# Patient Record
Sex: Female | Born: 1940 | Race: White | Hispanic: No | Marital: Married | Smoking: Former smoker
Health system: Southern US, Community
[De-identification: ages and names within clinical notes are randomized; demographics above are authoritative.]

## PROBLEM LIST (undated history)

## (undated) DIAGNOSIS — K8020 Calculus of gallbladder without cholecystitis without obstruction: Secondary | ICD-10-CM

## (undated) DIAGNOSIS — J189 Pneumonia, unspecified organism: Secondary | ICD-10-CM

## (undated) DIAGNOSIS — R519 Headache, unspecified: Secondary | ICD-10-CM

## (undated) DIAGNOSIS — Z8719 Personal history of other diseases of the digestive system: Secondary | ICD-10-CM

## (undated) DIAGNOSIS — T8859XA Other complications of anesthesia, initial encounter: Secondary | ICD-10-CM

## (undated) DIAGNOSIS — Z973 Presence of spectacles and contact lenses: Secondary | ICD-10-CM

## (undated) DIAGNOSIS — T4145XA Adverse effect of unspecified anesthetic, initial encounter: Secondary | ICD-10-CM

## (undated) DIAGNOSIS — R51 Headache: Secondary | ICD-10-CM

## (undated) DIAGNOSIS — Z972 Presence of dental prosthetic device (complete) (partial): Secondary | ICD-10-CM

## (undated) HISTORY — PX: TONSILLECTOMY: SUR1361

## (undated) HISTORY — PX: ABDOMINAL HYSTERECTOMY: SHX81

## (undated) HISTORY — PX: DILATION AND CURETTAGE OF UTERUS: SHX78

## (undated) HISTORY — PX: COLONOSCOPY: SHX174

## (undated) HISTORY — PX: SHOULDER ARTHROSCOPY: SHX128

## (undated) HISTORY — PX: BACK SURGERY: SHX140

## (undated) HISTORY — PX: CHOLECYSTECTOMY: SHX55

## (undated) HISTORY — PX: FOOT SURGERY: SHX648

## (undated) HISTORY — PX: MULTIPLE TOOTH EXTRACTIONS: SHX2053

---

## 2000-02-09 ENCOUNTER — Encounter: Admission: RE | Admit: 2000-02-09 | Discharge: 2000-02-09 | Payer: Self-pay | Admitting: Family Medicine

## 2000-02-09 ENCOUNTER — Encounter: Payer: Self-pay | Admitting: Family Medicine

## 2001-02-21 ENCOUNTER — Encounter: Admission: RE | Admit: 2001-02-21 | Discharge: 2001-02-21 | Payer: Self-pay | Admitting: Family Medicine

## 2001-02-21 ENCOUNTER — Encounter: Payer: Self-pay | Admitting: Family Medicine

## 2001-02-26 ENCOUNTER — Encounter: Admission: RE | Admit: 2001-02-26 | Discharge: 2001-02-26 | Payer: Self-pay | Admitting: Family Medicine

## 2001-02-26 ENCOUNTER — Encounter: Payer: Self-pay | Admitting: Family Medicine

## 2002-03-09 ENCOUNTER — Encounter: Admission: RE | Admit: 2002-03-09 | Discharge: 2002-03-09 | Payer: Self-pay | Admitting: Family Medicine

## 2002-03-09 ENCOUNTER — Encounter: Payer: Self-pay | Admitting: Family Medicine

## 2003-03-30 ENCOUNTER — Encounter: Admission: RE | Admit: 2003-03-30 | Discharge: 2003-03-30 | Payer: Self-pay | Admitting: Family Medicine

## 2003-03-30 ENCOUNTER — Encounter: Payer: Self-pay | Admitting: Family Medicine

## 2003-08-10 ENCOUNTER — Inpatient Hospital Stay (HOSPITAL_COMMUNITY): Admission: RE | Admit: 2003-08-10 | Discharge: 2003-08-13 | Payer: Self-pay | Admitting: Neurosurgery

## 2003-09-09 ENCOUNTER — Encounter: Admission: RE | Admit: 2003-09-09 | Discharge: 2003-09-09 | Payer: Self-pay | Admitting: Neurosurgery

## 2003-11-09 ENCOUNTER — Encounter: Admission: RE | Admit: 2003-11-09 | Discharge: 2003-11-09 | Payer: Self-pay | Admitting: Neurosurgery

## 2004-04-24 ENCOUNTER — Encounter: Admission: RE | Admit: 2004-04-24 | Discharge: 2004-04-24 | Payer: Self-pay | Admitting: Obstetrics and Gynecology

## 2005-05-23 ENCOUNTER — Encounter: Admission: RE | Admit: 2005-05-23 | Discharge: 2005-05-23 | Payer: Self-pay | Admitting: Obstetrics and Gynecology

## 2005-11-05 ENCOUNTER — Encounter: Admission: RE | Admit: 2005-11-05 | Discharge: 2005-11-05 | Payer: Self-pay | Admitting: Family Medicine

## 2006-05-27 ENCOUNTER — Encounter: Admission: RE | Admit: 2006-05-27 | Discharge: 2006-05-27 | Payer: Self-pay | Admitting: Obstetrics and Gynecology

## 2006-08-19 ENCOUNTER — Encounter: Admission: RE | Admit: 2006-08-19 | Discharge: 2006-08-19 | Payer: Self-pay | Admitting: Neurosurgery

## 2006-09-25 ENCOUNTER — Encounter: Admission: RE | Admit: 2006-09-25 | Discharge: 2006-09-25 | Payer: Self-pay | Admitting: Neurosurgery

## 2007-05-29 ENCOUNTER — Encounter: Admission: RE | Admit: 2007-05-29 | Discharge: 2007-05-29 | Payer: Self-pay | Admitting: Obstetrics and Gynecology

## 2007-06-23 ENCOUNTER — Encounter: Admission: RE | Admit: 2007-06-23 | Discharge: 2007-06-23 | Payer: Self-pay | Admitting: Family Medicine

## 2007-07-27 ENCOUNTER — Emergency Department (HOSPITAL_COMMUNITY): Admission: EM | Admit: 2007-07-27 | Discharge: 2007-07-27 | Payer: Self-pay | Admitting: Emergency Medicine

## 2008-06-01 ENCOUNTER — Encounter: Admission: RE | Admit: 2008-06-01 | Discharge: 2008-06-01 | Payer: Self-pay | Admitting: Family Medicine

## 2008-12-09 ENCOUNTER — Encounter: Admission: RE | Admit: 2008-12-09 | Discharge: 2008-12-09 | Payer: Self-pay | Admitting: Family Medicine

## 2009-06-06 ENCOUNTER — Encounter: Admission: RE | Admit: 2009-06-06 | Discharge: 2009-06-06 | Payer: Self-pay | Admitting: Family Medicine

## 2009-12-09 ENCOUNTER — Encounter: Admission: RE | Admit: 2009-12-09 | Discharge: 2009-12-09 | Payer: Self-pay | Admitting: Family Medicine

## 2010-06-12 ENCOUNTER — Encounter: Admission: RE | Admit: 2010-06-12 | Discharge: 2010-06-12 | Payer: Self-pay | Admitting: Family Medicine

## 2010-12-22 NOTE — Op Note (Signed)
NAME:  SHONIKA, KOLASINSKI NO.:  [id number]   MEDICAL RECORD NO.:  [id number]                   PATIENT TYPE:  INP   LOCATION:  3172                                 FACILITY:  [facility]   PHYSICIAN:  Kathaleen Maser. Pool, M.D.                 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   OPERATION PERFORMED:  L4-5 decompressive laminectomy with foraminotomies.  L4-5 posterior lumbar interbody fusion utilizing tangent wedges and local  autografting.  L4-5 posterolateral fusion utilizing pedicle screw  instrumentation and local autografting.   SURGEON:  Kathaleen Maser. Pool, M.D.   ASSISTANT:  Reinaldo Meeker, M.D.   ANESTHESIA:  General endotracheal.   INDICATIONS FOR PROCEDURE:  Ms. Spurling is a 70 year old female with history  of back and bilateral lower extremity pain right greater than left.  Work-  ups demonstrated evidence of a grade 1 L4-5 degenerative spondylolisthesis  with significant stenosis.  The patient has failed conservative management.  She presents now for decompression and fusion in hopes of improving her  symptoms.   DESCRIPTION OF PROCEDURE:  The patient was taken to the operating room and  placed on the table in the supine position.  After adequate level of  anesthesia was achieved, the patient was positioned prone onto a Wilson  frame and appropriately padded.  The patient's lumbar region was prepped and  draped sterilely.  A 10 blade was used to make a linear skin incision  overlying the L3, 4 and 5 levels.  This was carried down sharply in the  midline.  A subperiosteal dissection was then performed exposing the lamina  and facet joints of L3, L4 and L5 as well as the  transverse processes of L4  and L5 bilaterally.  Deep self-retaining retractor was placed.  Intraoperative x-ray was taken, the level was confirmed.  Complete  laminectomy of L4 was then performed using Leksell rongeurs, Kerrison  rongeurs and high speed drill to remove the entire lamina of L4 and the  entire inferior facet complex of L4 and the superior facet complex of L5 as  well as the superior aspect of the lamina of L5.  All bone was cleaned and  used in later autografting.  The ligamentum flavum was then elevated and  resected in piecemeal fashion using Kerrison rongeurs.  The underlying  thecal sac and exiting L4 and L5 nerve roots were identified.  Wide  foraminotomies were performed along their course.  This completely relieved  the patient's stenosis.  Epidural venous plexus was coagulated and cut.  Starting first on the left side, the thecal sac and nerve roots were  protected.  Disk space was  then incised with a 15 blade in rectangular  fashion.  A wide disk space cleanout was then achieved pituitary rongeurs  and upward angled pituitary rongeurs and Epstein curets.  After a very  thorough diskectomy had been performed, the disk space was dilated up to 12  mm and attention was placed to the right side.  Once again, thecal sac and  nerve roots were protected and an aggressive diskectomy was carried out on  the patient's right side.  Following the diskectomy, the patient's disk  space was then further reamed using a 12 mm tangent box cutter and then cut  using a 12 mm tangent chisel.  Soft tissue was removed from the interspace.  A 12 x 26 mm tangent wedge was then impacted into place and then recessed  approximately 2 mm from the posterior cortical margin.  Attention was then  placed to the patient's left side.  Thecal sac and nerve roots were then  protected on the left.  Distractor was removed.  The disk space was then  reamed and then cut with 12 mm tangent instruments.  The  disk space was  further curettaged.  Morselized autograft was then packed into the  interspace.  A second 12 x 26 mm tangent wedge was then impacted into place  and recessed approximately 2 mm from the posterior cortical margin.  Pedicles of L4 and L5 were then identified using surface landmarks and  intraoperative fluoroscopy.  Superficial bone around the pedicles was  removed using the high speed drill.  Each pedicle was then probed using a  pedicle awl.  Each pedicle awl tract was then probed and found to be solidly  within bone.  Each pedicle awl tract was then tapped with a 5.25 mm screw  tap.  Each screw pedicle was probed and found to be solidly within bone.  6.75 x 45 mm spiral 90D screws were placed bilaterally at L4 and 6.75 x 40  mm spiral 90 screws were placed bilaterally at L5.  Transverse processes of  L4 and L5 were then decorticated using high speed drill.  Morcelized  autograft was packed posterolaterally for later fusion.  A short segment  titanium rod was then contoured and placed over the screw heads of L4 and  L5.  This was then attached using locking caps.  Locking caps were then  engaged in sequential fashion with the construct under compression.  Final  images revealed good position of bone grafts and hardware at the proper  operative level with normal alignment of the spine.  The wound was then  irrigated with antibiotic solution one final time.  Gelfoam was placed  topically. A medium Hemovac drain was left in the epidural space.  The wound  was then closed in layers with Vicryl sutures.  Steri-Strips and sterile  dressing were applied.  There were no apparent complications.  The patient  tolerated the procedure well and returned to the recovery room  postoperatively.                                               Henry A. Pool, M.D.    HAP/MEDQ  D:  08/10/2003  T:  08/10/2003  Job:  [id number]

## 2010-12-22 NOTE — Discharge Summary (Signed)
NAME:  Eileen Simpson, Eileen Simpson NO.:  [id number]   MEDICAL RECORD NO.:  [id number]                   PATIENT TYPE:  INP   LOCATION:  3013                                 FACILITY:  [facility]   PHYSICIAN:  Kathaleen Maser. Pool, M.D.                 DATE OF BIRTH:  1941/06/21   DATE OF ADMISSION:  08/10/2003  DATE OF DISCHARGE:  08/13/2003                                 DISCHARGE SUMMARY   FINAL DIAGNOSIS:  L4-5 degenerative spondylolisthesis with stenosis.   OPERATIONS AND TREATMENTS:  L4-5 decompression and fusion with  instrumentation.   HISTORY OF PRESENT ILLNESS:  Ms. Pigford is a 70 year old female with a  history of back and bilateral lower extremity symptoms secondary to lumbar  stenosis.  Workups demonstrated evidence of significant degenerative  spondylolisthesis and marked stenosis.  The patient presents now for  decompression and fusion with instrumentation.   HOSPITAL COURSE:  The patient went to the operating room and underwent  uncomplicated L4-5 decompressive laminectomy followed by posterior lumbar  interbody fusion with Tangent wedges and local autografting coupled with  posterolateral fusion, using pedicle screw fixation and local autografting  was performed.  Postoperatively the patient did quite well.  Lower extremity  pain was completely resolved.  Her strength and sensation were improved.   At the time of discharge, the wound is healing well.  There is no evidence  of infection.  She is tolerating regular diet, ambulating without  difficulty.  This patient will be discharged home.  She will follow-up in my  office in one week.   CONDITION ON DISCHARGE:  Improved.                                                Henry A. Pool, M.D.    HAP/MEDQ  D:  09/29/2003  T:  09/30/2003  Job:  [id number]

## 2011-04-13 ENCOUNTER — Encounter (HOSPITAL_BASED_OUTPATIENT_CLINIC_OR_DEPARTMENT_OTHER)
Admission: RE | Admit: 2011-04-13 | Discharge: 2011-04-13 | Disposition: A | Discharge status: Home / Self Care | Payer: Medicare Other | Source: Ambulatory Visit | Attending: Orthopedic Surgery | Admitting: Orthopedic Surgery

## 2011-04-18 ENCOUNTER — Ambulatory Visit (HOSPITAL_BASED_OUTPATIENT_CLINIC_OR_DEPARTMENT_OTHER)
Admission: RE | Admit: 2011-04-18 | Discharge: 2011-04-18 | Disposition: A | Discharge status: Home / Self Care | Payer: Medicare Other | Source: Ambulatory Visit | Attending: Orthopedic Surgery | Admitting: Orthopedic Surgery

## 2011-04-18 DIAGNOSIS — Q66219 Congenital metatarsus primus varus, unspecified foot: Secondary | ICD-10-CM | POA: Insufficient documentation

## 2011-04-18 DIAGNOSIS — Z01812 Encounter for preprocedural laboratory examination: Secondary | ICD-10-CM | POA: Insufficient documentation

## 2011-04-18 DIAGNOSIS — Z01810 Encounter for preprocedural cardiovascular examination: Secondary | ICD-10-CM | POA: Insufficient documentation

## 2011-04-18 DIAGNOSIS — M2010 Hallux valgus (acquired), unspecified foot: Secondary | ICD-10-CM | POA: Insufficient documentation

## 2011-04-19 LAB — POCT HEMOGLOBIN-HEMACUE: Hemoglobin: 13.9 g/dL (ref 12.0–15.0)

## 2011-04-19 NOTE — Op Note (Signed)
NAME:  Eileen Simpson, Eileen Simpson NO.:  [id number]  MEDICAL RECORD NO.:  [id number]  LOCATION:                                 FACILITY:  PHYSICIAN:  Leonides Grills, M.D.     DATE OF BIRTH:  1940/09/08  DATE OF PROCEDURE:  04/18/2011 DATE OF DISCHARGE:                              OPERATIVE REPORT   PREOPERATIVE DIAGNOSES: 1. 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  POSTOPERATIVE DIAGNOSES: 1. 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  OPERATION: 1. Left first TMP joint fusion with osteotomy. 2. Left second TMP joint fusion without osteotomy. 3. Left modified McBride bunionectomy.  ANESTHESIA:  General.  SURGEON:  Leonides Grills, MD  ASSISTANT:  Richardean Canal, PA  ESTIMATED BLOOD LOSS:  Minimal.  TOURNIQUET TIME:  Approximately an hour and a half.  COMPLICATIONS:  None.  DISPOSITION:  Stable to PR.  INDICATIONS:  This is a 70 year old female who has had longstanding left bunion pain as well as midfoot pain that was interfering with her life to which point she could not do what she wants to.  Due to the above pathology, she was consented for the above procedure.  All risks of infection or vessel injury, nonunion, malunion, hardware tissue, hardware failure, persistent pain, worse pain, prolonged recovery, stiffness, arthritis, recurrence of hallux valgus, Bowman hallux varus, DVT, PE, wound healing problems were all explained, questions were encouraged and answered.  OPERATIVE PROCEDURE:  The patient was brought to the operating room, placed in a supine position.  After adequate general endotracheal anesthesia was administered as well as Ancef 1 g IV piggyback, left lower extremity was prepped and draped sterile manner over proximally placed thigh tourniquet.  Limb was gravity exsanguinated.  Tourniquet to elevated  to 290 mmHg.  A longitudinal incision over the dorsal aspect of the left great toe MTP joint laterally was then made.  Dissection was carried down through skin.  Hemostasis was obtained.  Careful dissection was carried down to the lateral capsule.  This was then released.  Once the lateral capsule was released, we then released the abductor tendon to the sesamoids as well as the adductor halluces to the base of first metatarsal.  Once this was done, we were able to reduce the sesamoids nicely.  We then made a longitudinal incision over the medial aspect of the great toe.  Midline medial dissection was carried down through skin. Hemostasis was obtained.  Dorsal and medial digital nerve was carefully dissected with retractor out of harm's way throughout the case.  L- shaped capsulotomy was then made.  Bunion eminence was trimmed off with a rongeur.  We then made a longitudinal incision midline between EHL and EHB.  Dissection was carried down through skin.  Hemostasis was obtained.  Interval was developed to an EHL and EHB first and second TMP joint.  Soft tissue was elevated off the dorsal aspect protecting the neurovascular structures and tendons.  Both tendons were protected within their sheaths and were not violated.  Once this was done, we then entered the first TMP joint and with a sagittal  saw an osteotomy was made in both the base of first metatarsal and medial cuneiform.  This was a closing wedge type osteotomy.  We then reduced the joint.  Once this was meticulously cleared and perfectly congruent, we obtained C-arm views to verify that this had excellent correction of the metatarsal primus varus.  We then took the second TMP joint and the Lisfranc ligament area.  Lisfranc's ligament was removed as well as the remaining cartilage from the second TMP joint as well curved corners osteotome, curette, and rongeur.  We then placed multiple 2-mm drill holes on either side of the joint.   We then reduced the first TMP joint with a two-point reduction clamp and compressed this down.  We then created a notch at the base of first metatarsal.  We then placed a 3.5-mm fully- threaded cortical lag screw using a 3.5 and 2.5 mm drill hole respectively.  This had excellent purchase, compression, and maintenance of the desired position.  We then obtained stress x-rays in the AP and lateral planes and showed gross motion fixation, proposition, and excellent alignment as well.  We then reduced the second TMP joint with a two-point reduction clamp.  Once this was reduced and compressed, we then made a small longitudinal incision over the medial aspect of the medial cuneiform.  We then placed a 3.5-mm fully-threaded cortical set screw using a 2.5-mm drill hole respectively.  This had excellent purchase and maintenance of desired position.  This was 32 mm in length. We then placed a 3.5-mm fully-threaded cortical lag screw from the medial cuneiform into the base of first metatarsal.  This was done with a 3.5 and 2.5 mm hole respectively.  This had excellent purchase and maintenance of the desired position and compression across the first TMP joint.  We then obtained stress x-rays in AP lateral planes which showed no gross motion fixation, proposition, and excellent alignment as well. We then reduced the sesamoids and advanced the capsule both superiorly and proximally, reconstructed this with 2-0 Vicryl stitches and had outstanding repair.  We then obtained x-rays to verify that the sesamoids were well located and the hallux valgus deformity was corrected.  The area was copiously irrigated with normal saline. Hemostasis was obtained.  Subcu was closed with 3-0 Vicryl, skin was closed with 4-0 nylon over all wounds.  Sterile dressing was applied. Modified Chamberland dressing was applied with the ankle in neutral dorsiflexion.  The patient was stable to PR.  Of note to the first ray was  adequately plantar flexed and this was palpated once this was fixed and a surgical PA assistant was used throughout the case to not only retract neurovascular structure out of harm's way, but also to aid in the fixation, reduction, and throughout the entire procedure.     Leonides Grills, M.D.     PB/MEDQ  D:  04/18/2011  T:  04/18/2011  Job:  [id number]  Electronically Signed by Leonides Grills M.D. on 04/19/2011 06:42:39 PM

## 2011-05-29 ENCOUNTER — Other Ambulatory Visit: Payer: Self-pay | Admitting: Family Medicine

## 2011-05-29 DIAGNOSIS — Z1231 Encounter for screening mammogram for malignant neoplasm of breast: Secondary | ICD-10-CM

## 2011-06-14 ENCOUNTER — Emergency Department (HOSPITAL_COMMUNITY)
Admission: EM | Admit: 2011-06-14 | Discharge: 2011-06-14 | Disposition: A | Discharge status: Home / Self Care | Payer: Medicare Other | Attending: Emergency Medicine | Admitting: Emergency Medicine

## 2011-06-14 ENCOUNTER — Encounter: Payer: Self-pay | Admitting: *Deleted

## 2011-06-14 DIAGNOSIS — R609 Edema, unspecified: Secondary | ICD-10-CM | POA: Insufficient documentation

## 2011-06-14 DIAGNOSIS — Z9889 Other specified postprocedural states: Secondary | ICD-10-CM | POA: Insufficient documentation

## 2011-06-14 DIAGNOSIS — I824Z9 Acute embolism and thrombosis of unspecified deep veins of unspecified distal lower extremity: Secondary | ICD-10-CM | POA: Insufficient documentation

## 2011-06-14 DIAGNOSIS — I82459 Acute embolism and thrombosis of unspecified peroneal vein: Secondary | ICD-10-CM

## 2011-06-14 DIAGNOSIS — M7989 Other specified soft tissue disorders: Secondary | ICD-10-CM | POA: Insufficient documentation

## 2011-06-14 LAB — CBC
MCH: 32.5 pg (ref 26.0–34.0)
MCHC: 33.9 g/dL (ref 30.0–36.0)
Platelets: 271 10*3/uL (ref 150–400)
RBC: 3.79 MIL/uL — ABNORMAL LOW (ref 3.87–5.11)

## 2011-06-14 LAB — DIFFERENTIAL
Basophils Relative: 0 % (ref 0–1)
Eosinophils Absolute: 0.1 10*3/uL (ref 0.0–0.7)
Lymphs Abs: 2.8 10*3/uL (ref 0.7–4.0)
Neutro Abs: 3 10*3/uL (ref 1.7–7.7)
Neutrophils Relative %: 46 % (ref 43–77)

## 2011-06-14 MED ORDER — ENOXAPARIN SODIUM 80 MG/0.8ML ~~LOC~~ SOLN
1.0000 mg/kg | Freq: Once | SUBCUTANEOUS | Status: AC
  Administered 2011-06-14: 70 mg via SUBCUTANEOUS
  Filled 2011-06-14 (×2): qty 0.8

## 2011-06-14 NOTE — ED Notes (Signed)
Pt states "was sent for a doppler today and they told us to come here, Dr. Clelia Croft would take care of it, I have a blood clot in my leg, had surgery on my left foot x 2 months ago"; pt presents with gamma walker

## 2011-06-14 NOTE — ED Provider Notes (Signed)
History     CSN: [id number] Arrival date & time: 06/14/2011  2:08 PM   First MD Initiated Contact with Patient 06/14/11 1611      Chief Complaint  Patient presents with  . Leg Pain    (Consider location/radiation/quality/duration/timing/severity/associated sxs/prior treatment) Patient is a 70 y.o. female presenting with leg pain. The history is provided by the patient.  Leg Pain  The incident occurred more than 2 days ago. The incident occurred at home. Injury mechanism: hx surgery. Pain location: L calf. The quality of the pain is described as throbbing and sharp. The pain is moderate. Pertinent negatives include no numbness. She has tried nothing for the symptoms.  Patient is seen by Dr. Lestine Box with orthopedics, and had reconstructive surgery on her left foot approximately 2 months ago. She has been in a hard cast since that time, and has been anticoagulated with 2 325 mg ASA daily. She states that for about the past week, she has noticed some pain in her calf. The cast was removed today, and given her pain she was sent for a Doppler of her leg to rule out DVT. Ultrasound showed that one of the paired peroneal veins in the mid calf has a thrombus. There is normal proximal and distal flow. She does not have any history of blood clots and is not a current smoker or on exogenous estrogen. She has not been immobile since her surgery.   Past Medical History  Diagnosis Date  . Glaucoma     Past Surgical History  Procedure Date  . Foot surgery   . Back surgery     lumbar fusion  . Abdominal hysterectomy     partial  . Dilation and curettage of uterus     History reviewed. No pertinent family history.  History  Substance Use Topics  . Smoking status: Former Games developer  . Smokeless tobacco: Not on file  . Alcohol Use: Yes     occassional    OB History    Grav Para Term Preterm Abortions TAB SAB Ect Mult Living                  Review of Systems  Constitutional: Negative for  fever, chills, diaphoresis, activity change, appetite change, fatigue and unexpected weight change.  HENT: Negative.   Eyes: Negative.   Respiratory: Negative for cough, chest tightness and shortness of breath.   Cardiovascular: Positive for leg swelling. Negative for chest pain and palpitations.  Gastrointestinal: Negative for nausea, vomiting, abdominal pain, diarrhea and constipation.  Genitourinary: Negative.   Musculoskeletal: Negative.   Skin: Negative for color change and rash.  Neurological: Negative for dizziness, seizures, weakness, light-headedness, numbness and headaches.  Hematological: Does not bruise/bleed easily.    Allergies  Codeine  Home Medications   Current Outpatient Rx  Name Route Sig Dispense Refill  . ASPIRIN EC 325 MG PO TBEC Oral Take 325 mg by mouth 2 (two) times daily.      Marland Kitchen BIMATOPROST 0.03 % OP SOLN Both Eyes Place 1 drop into both eyes at bedtime.      Marland Kitchen CALCIUM + D PO Oral Take 1 tablet by mouth daily.      Marland Kitchen GABAPENTIN 100 MG PO CAPS Oral Take 300 mg by mouth at bedtime.      Marland Kitchen HYDROCODONE-ACETAMINOPHEN 5-500 MG PO TABS Oral Take 1 tablet by mouth every 6 (six) hours as needed.      . METHOCARBAMOL 500 MG PO TABS Oral Take 500 mg  by mouth every 8 (eight) hours.     Carma Leaven M PLUS PO TABS Oral Take 1 tablet by mouth daily.      Marland Kitchen TIMOLOL HEMIHYDRATE 0.5 % OP SOLN Both Eyes Place 1 drop into both eyes 2 (two) times daily.      Marland Kitchen VITAMIN C 500 MG PO TABS Oral Take 500 mg by mouth daily.        BP 139/74  Pulse 67  Temp(Src) 98.5 F (36.9 C) (Oral)  Resp 16  Wt 159 lb (72.122 kg)  SpO2 99%  Physical Exam  Constitutional: She is oriented to person, place, and time. She appears well-developed and well-nourished. No distress.  HENT:  Head: Normocephalic and atraumatic.  Right Ear: External ear normal.  Left Ear: External ear normal.  Eyes: Conjunctivae and EOM are normal. Pupils are equal, round, and reactive to light.  Neck: Normal range of  motion. Neck supple.  Cardiovascular: Normal rate, regular rhythm and normal heart sounds.  Exam reveals no gallop and no friction rub.   No murmur heard. Pulmonary/Chest: Effort normal and breath sounds normal. She has no wheezes.  Abdominal: Soft. Bowel sounds are normal. She exhibits no distension. There is no tenderness.  Musculoskeletal: Normal range of motion. She exhibits edema.       Dry, peeling red skin to L lower leg (where cast was placed). C/D/I well healing surgical scars noted to dorsum of L foot at base of 2nd toe and at lateral MTP jt of great toe. Slight TTP over posterior calf. Ext neurovasc intact with sensory intact to lt touch. Full ROM at ankle, able to wiggle toes. Cap refill <3 secs. 2+ PT/DP pulses.  Neurological: She is alert and oriented to person, place, and time.  Skin: Skin is warm and dry. No rash noted. She is not diaphoretic.  Psychiatric: She has a normal mood and affect.    ED Course  Procedures (including critical care time)  Labs Reviewed  CBC - Abnormal; Notable for the following:    RBC 3.79 (*)    All other components within normal limits  DIFFERENTIAL  APTT   No results found.   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 Radford Pax. Will plan to anticoagulate here today with Lovenox injection. Patient to follow up tomorrow in office with Dr. Clelia Croft, her PCP for re-eval and further anticoagulation. Plan explained to patient and husband; they verbalized understanding and agreed to plan.       Grant Fontana, [place] 06/15/11 1002

## 2011-06-14 NOTE — ED Notes (Signed)
Dr Radford Pax bedside to answer pt questions and concerns, pt denies any other needs at this time

## 2011-06-14 NOTE — ED Notes (Signed)
Pt had left foot reconstructive surgery in Sept 2012.  She has had no problems up until a week ago when she started to have some pain in the left leg.  She still had a hard cast on at that time.  The cast was taken off today at the ortho office [facility]---Dr. Clydell Hakim.)  She was sent for a doppler study when they noticed some discoloration of the lower area of the leg.  The report is here and states that "one of the paired peroneal veins located within the mid calf demonstrates thrombus with no flow or compressibility. There is normal flow documented proximal and distal to this isolated thrombus."

## 2011-06-15 NOTE — ED Provider Notes (Signed)
Medical screening examination/treatment/procedure(s) were performed by non-physician practitioner and as supervising physician I was immediately available for consultation/collaboration.   Nelia Shi, MD 06/15/11 1039

## 2011-06-18 ENCOUNTER — Ambulatory Visit
Admission: RE | Admit: 2011-06-18 | Discharge: 2011-06-18 | Disposition: A | Discharge status: Home / Self Care | Payer: Medicare Other | Source: Ambulatory Visit | Attending: Family Medicine | Admitting: Family Medicine

## 2011-06-18 DIAGNOSIS — Z1231 Encounter for screening mammogram for malignant neoplasm of breast: Secondary | ICD-10-CM

## 2011-07-03 ENCOUNTER — Ambulatory Visit: Payer: Medicare Other | Attending: Orthopedic Surgery | Admitting: Rehabilitative and Restorative Service Providers"

## 2011-07-03 ENCOUNTER — Ambulatory Visit: Payer: Medicare Other | Admitting: Rehabilitative and Restorative Service Providers"

## 2011-07-03 DIAGNOSIS — M25579 Pain in unspecified ankle and joints of unspecified foot: Secondary | ICD-10-CM | POA: Insufficient documentation

## 2011-07-03 DIAGNOSIS — M25673 Stiffness of unspecified ankle, not elsewhere classified: Secondary | ICD-10-CM | POA: Insufficient documentation

## 2011-07-03 DIAGNOSIS — M25676 Stiffness of unspecified foot, not elsewhere classified: Secondary | ICD-10-CM | POA: Insufficient documentation

## 2011-07-03 DIAGNOSIS — R262 Difficulty in walking, not elsewhere classified: Secondary | ICD-10-CM | POA: Insufficient documentation

## 2011-07-03 DIAGNOSIS — IMO0001 Reserved for inherently not codable concepts without codable children: Secondary | ICD-10-CM | POA: Insufficient documentation

## 2011-07-05 ENCOUNTER — Ambulatory Visit: Payer: Medicare Other | Admitting: Rehabilitation

## 2011-07-10 ENCOUNTER — Ambulatory Visit: Payer: Medicare Other | Attending: Orthopedic Surgery | Admitting: Rehabilitation

## 2011-07-10 DIAGNOSIS — M25579 Pain in unspecified ankle and joints of unspecified foot: Secondary | ICD-10-CM | POA: Insufficient documentation

## 2011-07-10 DIAGNOSIS — M25676 Stiffness of unspecified foot, not elsewhere classified: Secondary | ICD-10-CM | POA: Insufficient documentation

## 2011-07-10 DIAGNOSIS — M25673 Stiffness of unspecified ankle, not elsewhere classified: Secondary | ICD-10-CM | POA: Insufficient documentation

## 2011-07-10 DIAGNOSIS — IMO0001 Reserved for inherently not codable concepts without codable children: Secondary | ICD-10-CM | POA: Insufficient documentation

## 2011-07-10 DIAGNOSIS — R262 Difficulty in walking, not elsewhere classified: Secondary | ICD-10-CM | POA: Insufficient documentation

## 2011-07-12 ENCOUNTER — Ambulatory Visit: Payer: Medicare Other | Admitting: Rehabilitation

## 2011-07-17 ENCOUNTER — Ambulatory Visit: Payer: Medicare Other | Admitting: Rehabilitation

## 2011-07-19 ENCOUNTER — Ambulatory Visit: Payer: Medicare Other | Admitting: Rehabilitation

## 2011-07-24 ENCOUNTER — Ambulatory Visit: Payer: Medicare Other | Admitting: Rehabilitation

## 2011-07-26 ENCOUNTER — Ambulatory Visit: Payer: Medicare Other | Admitting: Rehabilitation

## 2011-08-02 ENCOUNTER — Encounter: Payer: Medicare Other | Admitting: Rehabilitation

## 2011-08-09 ENCOUNTER — Ambulatory Visit: Payer: Medicare Other | Attending: Orthopedic Surgery | Admitting: Rehabilitation

## 2011-08-09 DIAGNOSIS — M25579 Pain in unspecified ankle and joints of unspecified foot: Secondary | ICD-10-CM | POA: Insufficient documentation

## 2011-08-09 DIAGNOSIS — IMO0001 Reserved for inherently not codable concepts without codable children: Secondary | ICD-10-CM | POA: Insufficient documentation

## 2011-08-09 DIAGNOSIS — M25676 Stiffness of unspecified foot, not elsewhere classified: Secondary | ICD-10-CM | POA: Insufficient documentation

## 2011-08-09 DIAGNOSIS — R262 Difficulty in walking, not elsewhere classified: Secondary | ICD-10-CM | POA: Insufficient documentation

## 2011-08-09 DIAGNOSIS — M25673 Stiffness of unspecified ankle, not elsewhere classified: Secondary | ICD-10-CM | POA: Insufficient documentation

## 2011-08-14 ENCOUNTER — Encounter: Payer: Medicare Other | Admitting: Rehabilitation

## 2011-08-16 ENCOUNTER — Encounter: Payer: Medicare Other | Admitting: Rehabilitation

## 2012-05-28 ENCOUNTER — Other Ambulatory Visit: Payer: Self-pay | Admitting: Family Medicine

## 2012-05-28 DIAGNOSIS — Z1231 Encounter for screening mammogram for malignant neoplasm of breast: Secondary | ICD-10-CM

## 2012-06-24 ENCOUNTER — Ambulatory Visit
Admission: RE | Admit: 2012-06-24 | Discharge: 2012-06-24 | Disposition: A | Discharge status: Home / Self Care | Payer: Medicare Other | Source: Ambulatory Visit | Attending: Family Medicine | Admitting: Family Medicine

## 2012-06-24 DIAGNOSIS — Z1231 Encounter for screening mammogram for malignant neoplasm of breast: Secondary | ICD-10-CM

## 2012-11-03 ENCOUNTER — Ambulatory Visit: Payer: Medicare Other | Attending: Orthopedic Surgery

## 2012-11-03 DIAGNOSIS — M25673 Stiffness of unspecified ankle, not elsewhere classified: Secondary | ICD-10-CM | POA: Insufficient documentation

## 2012-11-03 DIAGNOSIS — IMO0001 Reserved for inherently not codable concepts without codable children: Secondary | ICD-10-CM | POA: Insufficient documentation

## 2012-11-03 DIAGNOSIS — R262 Difficulty in walking, not elsewhere classified: Secondary | ICD-10-CM | POA: Insufficient documentation

## 2012-11-03 DIAGNOSIS — M25676 Stiffness of unspecified foot, not elsewhere classified: Secondary | ICD-10-CM | POA: Insufficient documentation

## 2012-11-03 DIAGNOSIS — M25579 Pain in unspecified ankle and joints of unspecified foot: Secondary | ICD-10-CM | POA: Insufficient documentation

## 2012-11-05 ENCOUNTER — Ambulatory Visit: Payer: Medicare Other | Attending: Orthopedic Surgery | Admitting: Rehabilitation

## 2012-11-05 DIAGNOSIS — R262 Difficulty in walking, not elsewhere classified: Secondary | ICD-10-CM | POA: Insufficient documentation

## 2012-11-05 DIAGNOSIS — IMO0001 Reserved for inherently not codable concepts without codable children: Secondary | ICD-10-CM | POA: Insufficient documentation

## 2012-11-05 DIAGNOSIS — M25673 Stiffness of unspecified ankle, not elsewhere classified: Secondary | ICD-10-CM | POA: Insufficient documentation

## 2012-11-05 DIAGNOSIS — M25579 Pain in unspecified ankle and joints of unspecified foot: Secondary | ICD-10-CM | POA: Insufficient documentation

## 2012-11-05 DIAGNOSIS — M25676 Stiffness of unspecified foot, not elsewhere classified: Secondary | ICD-10-CM | POA: Insufficient documentation

## 2012-11-11 ENCOUNTER — Ambulatory Visit: Payer: Medicare Other | Admitting: Rehabilitation

## 2012-11-14 ENCOUNTER — Ambulatory Visit: Payer: Medicare Other | Admitting: Rehabilitation

## 2012-11-18 ENCOUNTER — Ambulatory Visit: Payer: Medicare Other | Admitting: Rehabilitation

## 2012-11-25 ENCOUNTER — Ambulatory Visit: Payer: Medicare Other | Admitting: Rehabilitation

## 2012-11-28 ENCOUNTER — Ambulatory Visit: Payer: Medicare Other | Admitting: Rehabilitation

## 2012-12-02 ENCOUNTER — Ambulatory Visit: Payer: Medicare Other

## 2012-12-05 ENCOUNTER — Ambulatory Visit: Payer: Medicare Other

## 2012-12-09 ENCOUNTER — Ambulatory Visit: Payer: Medicare Other | Attending: Orthopedic Surgery | Admitting: Rehabilitation

## 2012-12-09 DIAGNOSIS — R262 Difficulty in walking, not elsewhere classified: Secondary | ICD-10-CM | POA: Insufficient documentation

## 2012-12-09 DIAGNOSIS — M25676 Stiffness of unspecified foot, not elsewhere classified: Secondary | ICD-10-CM | POA: Insufficient documentation

## 2012-12-09 DIAGNOSIS — IMO0001 Reserved for inherently not codable concepts without codable children: Secondary | ICD-10-CM | POA: Insufficient documentation

## 2012-12-09 DIAGNOSIS — M25673 Stiffness of unspecified ankle, not elsewhere classified: Secondary | ICD-10-CM | POA: Insufficient documentation

## 2012-12-09 DIAGNOSIS — M25579 Pain in unspecified ankle and joints of unspecified foot: Secondary | ICD-10-CM | POA: Insufficient documentation

## 2012-12-12 ENCOUNTER — Ambulatory Visit: Payer: Medicare Other | Admitting: Rehabilitation

## 2013-05-26 ENCOUNTER — Other Ambulatory Visit: Payer: Self-pay

## 2013-05-26 DIAGNOSIS — Z1231 Encounter for screening mammogram for malignant neoplasm of breast: Secondary | ICD-10-CM

## 2013-06-26 ENCOUNTER — Ambulatory Visit
Admission: RE | Admit: 2013-06-26 | Discharge: 2013-06-26 | Disposition: A | Discharge status: Home / Self Care | Payer: Medicare Other | Source: Ambulatory Visit

## 2013-06-26 DIAGNOSIS — Z1231 Encounter for screening mammogram for malignant neoplasm of breast: Secondary | ICD-10-CM

## 2014-06-01 ENCOUNTER — Other Ambulatory Visit: Payer: Self-pay

## 2014-06-01 DIAGNOSIS — Z1239 Encounter for other screening for malignant neoplasm of breast: Secondary | ICD-10-CM

## 2014-06-25 ENCOUNTER — Other Ambulatory Visit: Payer: Self-pay

## 2014-06-25 DIAGNOSIS — Z1231 Encounter for screening mammogram for malignant neoplasm of breast: Secondary | ICD-10-CM

## 2014-06-28 ENCOUNTER — Ambulatory Visit
Admission: RE | Admit: 2014-06-28 | Discharge: 2014-06-28 | Disposition: A | Discharge status: Home / Self Care | Payer: Medicare Other | Source: Ambulatory Visit

## 2014-06-28 DIAGNOSIS — Z1231 Encounter for screening mammogram for malignant neoplasm of breast: Secondary | ICD-10-CM

## 2015-06-07 ENCOUNTER — Other Ambulatory Visit: Payer: Self-pay

## 2015-06-07 DIAGNOSIS — Z1231 Encounter for screening mammogram for malignant neoplasm of breast: Secondary | ICD-10-CM

## 2015-07-08 ENCOUNTER — Ambulatory Visit
Admission: RE | Admit: 2015-07-08 | Discharge: 2015-07-08 | Disposition: A | Discharge status: Home / Self Care | Payer: Medicare Other | Source: Ambulatory Visit

## 2015-07-08 DIAGNOSIS — Z1231 Encounter for screening mammogram for malignant neoplasm of breast: Secondary | ICD-10-CM

## 2016-06-14 ENCOUNTER — Other Ambulatory Visit: Payer: Self-pay | Admitting: Family Medicine

## 2016-06-14 DIAGNOSIS — Z1231 Encounter for screening mammogram for malignant neoplasm of breast: Secondary | ICD-10-CM

## 2016-06-16 LAB — GLUCOSE, POCT (MANUAL RESULT ENTRY): POC GLUCOSE: 107 mg/dL — AB (ref 70–99)

## 2016-07-17 ENCOUNTER — Ambulatory Visit
Admission: RE | Admit: 2016-07-17 | Discharge: 2016-07-17 | Disposition: A | Discharge status: Home / Self Care | Payer: Medicare Other | Source: Ambulatory Visit | Attending: Family Medicine | Admitting: Family Medicine

## 2016-07-17 DIAGNOSIS — Z1231 Encounter for screening mammogram for malignant neoplasm of breast: Secondary | ICD-10-CM

## 2016-09-14 DIAGNOSIS — R197 Diarrhea, unspecified: Secondary | ICD-10-CM | POA: Diagnosis not present

## 2016-10-02 DIAGNOSIS — L821 Other seborrheic keratosis: Secondary | ICD-10-CM | POA: Diagnosis not present

## 2016-10-02 DIAGNOSIS — D485 Neoplasm of uncertain behavior of skin: Secondary | ICD-10-CM | POA: Diagnosis not present

## 2016-10-02 DIAGNOSIS — D225 Melanocytic nevi of trunk: Secondary | ICD-10-CM | POA: Diagnosis not present

## 2016-10-02 DIAGNOSIS — Z85828 Personal history of other malignant neoplasm of skin: Secondary | ICD-10-CM | POA: Diagnosis not present

## 2016-10-02 DIAGNOSIS — L814 Other melanin hyperpigmentation: Secondary | ICD-10-CM | POA: Diagnosis not present

## 2016-10-02 DIAGNOSIS — D1801 Hemangioma of skin and subcutaneous tissue: Secondary | ICD-10-CM | POA: Diagnosis not present

## 2016-10-09 ENCOUNTER — Other Ambulatory Visit: Payer: Self-pay | Admitting: Family Medicine

## 2016-10-09 DIAGNOSIS — R1030 Lower abdominal pain, unspecified: Secondary | ICD-10-CM | POA: Diagnosis not present

## 2016-10-12 ENCOUNTER — Ambulatory Visit
Admission: RE | Admit: 2016-10-12 | Discharge: 2016-10-12 | Disposition: A | Discharge status: Home / Self Care | Payer: Self-pay | Source: Ambulatory Visit | Attending: Family Medicine | Admitting: Family Medicine

## 2016-10-12 DIAGNOSIS — R1030 Lower abdominal pain, unspecified: Secondary | ICD-10-CM

## 2016-10-12 DIAGNOSIS — K5730 Diverticulosis of large intestine without perforation or abscess without bleeding: Secondary | ICD-10-CM | POA: Diagnosis not present

## 2016-10-12 MED ORDER — IOPAMIDOL (ISOVUE-300) INJECTION 61%
100.0000 mL | Freq: Once | INTRAVENOUS | Status: AC | PRN
Start: 2016-10-12 — End: 2016-10-12
  Administered 2016-10-12: 100 mL via INTRAVENOUS

## 2016-11-10 DIAGNOSIS — S39012A Strain of muscle, fascia and tendon of lower back, initial encounter: Secondary | ICD-10-CM | POA: Diagnosis not present

## 2016-11-12 DIAGNOSIS — K838 Other specified diseases of biliary tract: Secondary | ICD-10-CM | POA: Diagnosis not present

## 2016-11-13 ENCOUNTER — Other Ambulatory Visit: Payer: Self-pay | Admitting: Gastroenterology

## 2016-11-13 DIAGNOSIS — K838 Other specified diseases of biliary tract: Secondary | ICD-10-CM

## 2016-11-16 DIAGNOSIS — H401131 Primary open-angle glaucoma, bilateral, mild stage: Secondary | ICD-10-CM | POA: Diagnosis not present

## 2016-11-21 ENCOUNTER — Ambulatory Visit
Admission: RE | Admit: 2016-11-21 | Discharge: 2016-11-21 | Disposition: A | Discharge status: Home / Self Care | Payer: Medicare HMO | Source: Ambulatory Visit | Attending: Family Medicine | Admitting: Family Medicine

## 2016-11-21 ENCOUNTER — Other Ambulatory Visit: Payer: Self-pay | Admitting: Family Medicine

## 2016-11-21 DIAGNOSIS — M545 Low back pain: Secondary | ICD-10-CM

## 2016-11-21 DIAGNOSIS — M7062 Trochanteric bursitis, left hip: Secondary | ICD-10-CM | POA: Diagnosis not present

## 2016-11-21 DIAGNOSIS — M549 Dorsalgia, unspecified: Secondary | ICD-10-CM | POA: Diagnosis not present

## 2016-11-24 ENCOUNTER — Ambulatory Visit
Admission: RE | Admit: 2016-11-24 | Discharge: 2016-11-24 | Disposition: A | Discharge status: Home / Self Care | Payer: Medicare HMO | Source: Ambulatory Visit | Attending: Gastroenterology | Admitting: Gastroenterology

## 2016-11-24 DIAGNOSIS — K831 Obstruction of bile duct: Secondary | ICD-10-CM | POA: Diagnosis not present

## 2016-11-24 DIAGNOSIS — K838 Other specified diseases of biliary tract: Secondary | ICD-10-CM

## 2016-11-24 MED ORDER — GADOBENATE DIMEGLUMINE 529 MG/ML IV SOLN
15.0000 mL | Freq: Once | INTRAVENOUS | Status: AC | PRN
  Administered 2016-11-24: 13 mL via INTRAVENOUS

## 2016-11-29 DIAGNOSIS — M5442 Lumbago with sciatica, left side: Secondary | ICD-10-CM | POA: Diagnosis not present

## 2016-12-03 ENCOUNTER — Other Ambulatory Visit: Payer: Self-pay | Admitting: Gastroenterology

## 2016-12-03 DIAGNOSIS — R932 Abnormal findings on diagnostic imaging of liver and biliary tract: Secondary | ICD-10-CM | POA: Diagnosis not present

## 2016-12-03 DIAGNOSIS — K838 Other specified diseases of biliary tract: Secondary | ICD-10-CM | POA: Diagnosis not present

## 2016-12-06 DIAGNOSIS — M5442 Lumbago with sciatica, left side: Secondary | ICD-10-CM | POA: Diagnosis not present

## 2016-12-11 ENCOUNTER — Other Ambulatory Visit: Payer: Self-pay | Admitting: Gastroenterology

## 2016-12-11 ENCOUNTER — Encounter (HOSPITAL_COMMUNITY): Payer: Self-pay | Admitting: *Deleted

## 2016-12-11 NOTE — Progress Notes (Signed)
Pt denies SOB, chest pain, and being under the care of a cardiologist. Pt denies having a stress test,echo and cardiac cath. Pt denies having an EKG and chest x ray within the last year. Pt made aware to stop taking  Aspirin, vitamins, fish oil and herbal medications. Do not take any NSAIDs ie: Ibuprofen, Advil, Naproxen, BC and Goody Powder or any medication containing Aspirin. Pt verbalized understanding of all pre-op instructions.

## 2016-12-12 ENCOUNTER — Ambulatory Visit (HOSPITAL_COMMUNITY)
Admission: RE | Admit: 2016-12-12 | Discharge: 2016-12-12 | Disposition: A | Discharge status: Home / Self Care | Payer: Medicare HMO | Source: Ambulatory Visit | Attending: Gastroenterology | Admitting: Gastroenterology

## 2016-12-12 ENCOUNTER — Ambulatory Visit (HOSPITAL_COMMUNITY): Payer: Medicare HMO

## 2016-12-12 ENCOUNTER — Encounter (HOSPITAL_COMMUNITY)
Admission: RE | Disposition: A | Discharge status: Home / Self Care | Payer: Self-pay | Source: Ambulatory Visit | Attending: Gastroenterology

## 2016-12-12 ENCOUNTER — Encounter (HOSPITAL_COMMUNITY): Payer: Self-pay

## 2016-12-12 ENCOUNTER — Ambulatory Visit (HOSPITAL_COMMUNITY): Payer: Medicare HMO | Admitting: Anesthesiology

## 2016-12-12 DIAGNOSIS — Z7982 Long term (current) use of aspirin: Secondary | ICD-10-CM | POA: Insufficient documentation

## 2016-12-12 DIAGNOSIS — Z87891 Personal history of nicotine dependence: Secondary | ICD-10-CM | POA: Insufficient documentation

## 2016-12-12 DIAGNOSIS — K831 Obstruction of bile duct: Secondary | ICD-10-CM | POA: Diagnosis not present

## 2016-12-12 DIAGNOSIS — K8050 Calculus of bile duct without cholangitis or cholecystitis without obstruction: Secondary | ICD-10-CM | POA: Diagnosis not present

## 2016-12-12 DIAGNOSIS — Z79899 Other long term (current) drug therapy: Secondary | ICD-10-CM | POA: Insufficient documentation

## 2016-12-12 DIAGNOSIS — M533 Sacrococcygeal disorders, not elsewhere classified: Secondary | ICD-10-CM | POA: Diagnosis not present

## 2016-12-12 DIAGNOSIS — M4686 Other specified inflammatory spondylopathies, lumbar region: Secondary | ICD-10-CM | POA: Diagnosis not present

## 2016-12-12 DIAGNOSIS — R198 Other specified symptoms and signs involving the digestive system and abdomen: Secondary | ICD-10-CM

## 2016-12-12 HISTORY — DX: Presence of dental prosthetic device (complete) (partial): Z97.2

## 2016-12-12 HISTORY — DX: Headache: R51

## 2016-12-12 HISTORY — DX: Other complications of anesthesia, initial encounter: T88.59XA

## 2016-12-12 HISTORY — DX: Headache, unspecified: R51.9

## 2016-12-12 HISTORY — PX: ERCP: SHX5425

## 2016-12-12 HISTORY — DX: Pneumonia, unspecified organism: J18.9

## 2016-12-12 HISTORY — DX: Personal history of other diseases of the digestive system: Z87.19

## 2016-12-12 HISTORY — DX: Calculus of gallbladder without cholecystitis without obstruction: K80.20

## 2016-12-12 HISTORY — DX: Presence of spectacles and contact lenses: Z97.3

## 2016-12-12 HISTORY — DX: Adverse effect of unspecified anesthetic, initial encounter: T41.45XA

## 2016-12-12 LAB — COMPREHENSIVE METABOLIC PANEL
ALBUMIN: 3.7 g/dL (ref 3.5–5.0)
ALK PHOS: 67 U/L (ref 38–126)
ALT: 71 U/L — ABNORMAL HIGH (ref 14–54)
ANION GAP: 9 (ref 5–15)
AST: 44 U/L — ABNORMAL HIGH (ref 15–41)
BILIRUBIN TOTAL: 0.7 mg/dL (ref 0.3–1.2)
BUN: 17 mg/dL (ref 6–20)
CALCIUM: 9.4 mg/dL (ref 8.9–10.3)
CO2: 26 mmol/L (ref 22–32)
Chloride: 106 mmol/L (ref 101–111)
Creatinine, Ser: 0.87 mg/dL (ref 0.44–1.00)
GFR calc non Af Amer: >60 mL/min (ref >60)
Glucose, Bld: 95 mg/dL (ref 65–99)
POTASSIUM: 3.9 mmol/L (ref 3.5–5.1)
SODIUM: 141 mmol/L (ref 135–145)
TOTAL PROTEIN: 6.7 g/dL (ref 6.5–8.1)

## 2016-12-12 LAB — CBC
HCT: 38.8 % (ref 36.0–46.0)
HEMOGLOBIN: 12.8 g/dL (ref 12.0–15.0)
MCH: 32 pg (ref 26.0–34.0)
MCHC: 33 g/dL (ref 30.0–36.0)
MCV: 97 fL (ref 78.0–100.0)
Platelets: 218 10*3/uL (ref 150–400)
RBC: 4 MIL/uL (ref 3.87–5.11)
RDW: 12.9 % (ref 11.5–15.5)
WBC: 5.9 10*3/uL (ref 4.0–10.5)

## 2016-12-12 SURGERY — ERCP, WITH INTERVENTION IF INDICATED
Anesthesia: General

## 2016-12-12 MED ORDER — FENTANYL CITRATE (PF) 100 MCG/2ML IJ SOLN: 25.0000 ug | INTRAMUSCULAR | Status: DC | PRN

## 2016-12-12 MED ORDER — ONDANSETRON HCL 4 MG/2ML IJ SOLN
INTRAMUSCULAR | Status: DC | PRN
  Administered 2016-12-12: 4 mg via INTRAVENOUS

## 2016-12-12 MED ORDER — GLUCAGON HCL RDNA (DIAGNOSTIC) 1 MG IJ SOLR
INTRAMUSCULAR | Status: AC
  Filled 2016-12-12: qty 1

## 2016-12-12 MED ORDER — DEXTROSE 5 % IV SOLN: 1.0000 g | Freq: Once | INTRAVENOUS | Status: DC

## 2016-12-12 MED ORDER — FENTANYL CITRATE (PF) 100 MCG/2ML IJ SOLN
INTRAMUSCULAR | Status: DC | PRN
  Administered 2016-12-12: 50 ug via INTRAVENOUS
  Administered 2016-12-12: 25 ug via INTRAVENOUS
  Administered 2016-12-12: 50 ug via INTRAVENOUS

## 2016-12-12 MED ORDER — FENTANYL CITRATE (PF) 250 MCG/5ML IJ SOLN
INTRAMUSCULAR | Status: AC
  Filled 2016-12-12: qty 5

## 2016-12-12 MED ORDER — ROCURONIUM BROMIDE 100 MG/10ML IV SOLN
INTRAVENOUS | Status: DC | PRN
  Administered 2016-12-12: 40 mg via INTRAVENOUS

## 2016-12-12 MED ORDER — PROPOFOL 10 MG/ML IV BOLUS
INTRAVENOUS | Status: DC | PRN
  Administered 2016-12-12: 150 mg via INTRAVENOUS

## 2016-12-12 MED ORDER — LACTATED RINGERS IV SOLN
INTRAVENOUS | Status: DC
  Administered 2016-12-12: 09:00:00 via INTRAVENOUS

## 2016-12-12 MED ORDER — IOPAMIDOL (ISOVUE-300) INJECTION 61%
INTRAVENOUS | Status: AC
  Filled 2016-12-12: qty 50

## 2016-12-12 MED ORDER — ONDANSETRON HCL 4 MG/2ML IJ SOLN: 4.0000 mg | Freq: Once | INTRAMUSCULAR | Status: DC | PRN

## 2016-12-12 MED ORDER — SUGAMMADEX SODIUM 200 MG/2ML IV SOLN
INTRAVENOUS | Status: DC | PRN
  Administered 2016-12-12: 150 mg via INTRAVENOUS

## 2016-12-12 MED ORDER — INDOMETHACIN 50 MG RE SUPP
RECTAL | Status: AC
  Filled 2016-12-12: qty 2

## 2016-12-12 MED ORDER — LIDOCAINE HCL (CARDIAC) 20 MG/ML IV SOLN
INTRAVENOUS | Status: DC | PRN
  Administered 2016-12-12: 40 mg via INTRAVENOUS

## 2016-12-12 MED ORDER — PHENYLEPHRINE HCL 10 MG/ML IJ SOLN
INTRAMUSCULAR | Status: DC | PRN
  Administered 2016-12-12: 40 ug via INTRAVENOUS
  Administered 2016-12-12: 80 ug via INTRAVENOUS
  Administered 2016-12-12 (×3): 40 ug via INTRAVENOUS

## 2016-12-12 MED ORDER — SODIUM CHLORIDE 0.9 % IV SOLN
INTRAVENOUS | Status: DC | PRN
  Administered 2016-12-12: 30 mL

## 2016-12-12 MED ORDER — SODIUM CHLORIDE 0.9 % IV SOLN: INTRAVENOUS | Status: DC

## 2016-12-12 NOTE — Op Note (Signed)
[facility] Patient Name: Eileen Simpson Procedure Date : 12/12/2016 MRN: [id number] Attending MD: Clarene Essex , MD Date of Birth: September 07, 1940 CSN: [id number] Age: 76 Admit Type: Outpatient Procedure:                ERCP Indications:              Bile duct stone(s) Providers:                Clarene Essex, MD, Cleda Daub, RN, Corliss Parish,                            Technician Referring MD:              Medicines:                General Anesthesia Complications:            No immediate complications. Estimated Blood Loss:     Estimated blood loss: none. Procedure:                Pre-Anesthesia Assessment:                           - Prior to the procedure, a History and Physical                            was performed, and patient medications and                            allergies were reviewed. The patient's tolerance of                            previous anesthesia was also reviewed. The risks                            and benefits of the procedure and the sedation                            options and risks were discussed with the patient.                            All questions were answered, and informed consent                            was obtained. Prior Anticoagulants: The patient has                            taken aspirin, last dose was 1 day prior to                            procedure. ASA Grade Assessment: II - A patient                            with mild systemic disease. After reviewing the  risks and benefits, the patient was deemed in                            satisfactory condition to undergo the procedure.                           After obtaining informed consent, the scope was                            passed under direct vision. Throughout the                            procedure, the patient's blood pressure, pulse, and                            oxygen saturations were monitored continuously. The                  [phone]) scope was introduced through                            the mouth, and used to inject contrast into and                            used to locate the major papilla. The ERCP was                            accomplished without difficulty. The patient                            tolerated the procedure well. Scope In: Scope Out: Findings:      The major papilla was some distance away from a very small diverticulum.       Biliary sphincterotomy was made with a Hydratome sphincterotome using       ERBE electrocautery after deep selective cannulation was obtained and       the CBD was dilated but no obvious stone was seen and there was no       cystic duct filling and we proceeded with the sphincterotomy in the       customary fashion until he had adequate biliary drainage and the fully       bowed sphincterotome could get in and out of the duct. There was no       post-sphincterotomy bleeding. To discover objects, the biliary tree was       swept with a 12-15 mm balloon starting at the bifurcation. to easily       withdrawal through the patent sphincterotomy site we did lower the       balloon from 15-12 and multiple pass-throughs were done without obvious       stone being delivered andNothing was found. we proceeded with an       occlusion cholangiogram in the customary fashion and no stones in the       upper ducts were seen however the distal CBD had increased air which       made complete visualization difficult but no debris or stone was       delivered on subsequent pull-through and  there was adequate biliary       drainage at the end of the procedure and the patient tolerated the       procedure well Impression:               - The major papilla was some distance away from a                            diverticulum.                           - A biliary sphincterotomy was performed.                           - The biliary tree was swept and nothing  was found. Moderate Sedation:      moderate sedation-none Recommendation:           - Clear liquid diet for 6 hours. slowly advance as                            tolerated                           - Continue present medications.                           - Return to GI clinic PRN. consider cholecystectomy                            if she becomes symptomatic and would proceed with                            Intra-Op cholangiogram at that point to confirm no                            residual stones                           - Telephone GI clinic if symptomatic PRN. Procedure Code(s):        --- Professional ---                           [phone], Esophagogastroduodenoscopy, flexible,                            transoral; diagnostic, including collection of                            specimen(s) by brushing or washing, when performed                            (separate procedure) Diagnosis Code(s):        --- Professional ---                           K80.50, Calculus of bile duct without cholangitis  or cholecystitis without obstruction CPT copyright 2016 American Medical Association. All rights reserved. The codes documented in this report are preliminary and upon coder review may  be revised to meet current compliance requirements. Clarene Essex, MD 12/12/2016 10:10:30 AM This report has been signed electronically. Number of Addenda: 0

## 2016-12-12 NOTE — Anesthesia Procedure Notes (Signed)
Procedure Name: Intubation Date/Time: 12/12/2016 9:27 AM Performed by: Shirlyn Goltz Pre-anesthesia Checklist: Patient identified, Emergency Drugs available, Suction available and Patient being monitored Patient Re-evaluated:Patient Re-evaluated prior to inductionOxygen Delivery Method: Circle system utilized Preoxygenation: Pre-oxygenation with 100% oxygen Intubation Type: IV induction Ventilation: Mask ventilation without difficulty Laryngoscope Size: Mac and 3 Grade View: Grade I Tube type: Oral Tube size: 7.0 mm Number of attempts: 1 Airway Equipment and Method: Stylet Placement Confirmation: ETT inserted through vocal cords under direct vision,  positive ETCO2 and breath sounds checked- equal and bilateral Secured at: 21 cm Tube secured with: Tape Dental Injury: Teeth and Oropharynx as per pre-operative assessment

## 2016-12-12 NOTE — Anesthesia Postprocedure Evaluation (Addendum)
Anesthesia Post Note  Patient: Eileen Simpson  Procedure(s) Performed: Procedure(s) (LRB): ENDOSCOPIC RETROGRADE CHOLANGIOPANCREATOGRAPHY (ERCP) (N/A)  Patient location during evaluation: PACU Anesthesia Type: General Level of consciousness: awake, awake and alert and oriented Pain management: pain level controlled Vital Signs Assessment: post-procedure vital signs reviewed and stable Respiratory status: spontaneous breathing and respiratory function stable Anesthetic complications: no       Last Vitals:  Vitals:   12/12/16 1100 12/12/16 1128  BP: 134/87 130/67  Pulse: 67 60  Resp: 11 12  Temp:      Last Pain:  Vitals:   12/12/16 1100  TempSrc:   PainSc: 6                  Shawnia Vizcarrondo COKER

## 2016-12-12 NOTE — Progress Notes (Signed)
Eileen Simpson 9:17 AM  Subjective: Patient with no GI complaints and no new complaints since we saw her in the office and she'll see her orthopedist about her hip later today which is her primary concern  Objective: Vital signs stable afebrile exam please see preassessment evaluation  Assessment: CBD stones on MRCP  Plan: Okay to proceed with ERCP with anesthesia assistance and again the procedure including risks and success rate was discussed with her and the family  [facility]  Pager [phone] After 5PM or if no answer call [phone]

## 2016-12-12 NOTE — Transfer of Care (Signed)
Immediate Anesthesia Transfer of Care Note  Patient: Eileen Simpson  Procedure(s) Performed: Procedure(s): ENDOSCOPIC RETROGRADE CHOLANGIOPANCREATOGRAPHY (ERCP) (N/A)  Patient Location: PACU and Endoscopy Unit  Anesthesia Type:General  Level of Consciousness: awake, alert , oriented and patient cooperative  Airway & Oxygen Therapy: Patient Spontanous Breathing and Patient connected to nasal cannula oxygen  Post-op Assessment: Report given to RN and Post -op Vital signs reviewed and stable  Post vital signs: Reviewed and stable  Last Vitals:  Vitals:   12/12/16 0850  BP: (!) 144/84  Pulse: 64  Resp: 13  Temp: 36.6 C    Last Pain:  Vitals:   12/12/16 0850  TempSrc: Oral  PainSc: 7          Complications: No apparent anesthesia complications

## 2016-12-12 NOTE — Anesthesia Preprocedure Evaluation (Addendum)
Anesthesia Evaluation  Patient identified by MRN, date of birth, ID band Patient awake    Reviewed: Allergy & Precautions, NPO status , Patient's Chart, lab work & pertinent test results  Airway Mallampati: II  TM Distance: >3 FB     Dental  (+) Dental Advisory Given, Teeth Intact   Pulmonary former smoker,    breath sounds clear to auscultation       Cardiovascular  Rhythm:Regular Rate:Normal     Neuro/Psych    GI/Hepatic   Endo/Other    Renal/GU      Musculoskeletal   Abdominal   Peds  Hematology   Anesthesia Other Findings   Reproductive/Obstetrics                            Anesthesia Physical Anesthesia Plan  ASA: III  Anesthesia Plan: General   Post-op Pain Management:    Induction: Intravenous  Airway Management Planned: Oral ETT  Additional Equipment:   Intra-op Plan:   Post-operative Plan: Extubation in OR  Informed Consent: I have reviewed the patients History and Physical, chart, labs and discussed the procedure including the risks, benefits and alternatives for the proposed anesthesia with the patient or authorized representative who has indicated his/her understanding and acceptance.   Dental advisory given  Plan Discussed with: CRNA and Anesthesiologist  Anesthesia Plan Comments:         Anesthesia Quick Evaluation

## 2016-12-12 NOTE — Discharge Instructions (Signed)
Clear liquid diet only until 4 PM and if doing well may have soft solids tonight and slowly advance tomorrow and call if increased abdominal pain nausea vomiting or signs of GI bleeding or black diarrhea or if fever chills etc. and follow-up as needed and will consider gallbladder removal at some point    YOU HAD AN ENDOSCOPIC PROCEDURE TODAY: Refer to the procedure report and other information in the discharge instructions given to you for any specific questions about what was found during the examination. If this information does not answer your questions, please call [facility] office at [phone] to clarify.   YOU SHOULD EXPECT: Some feelings of bloating in the abdomen. Passage of more gas than usual. Walking can help get rid of the air that was put into your GI tract during the procedure and reduce the bloating. If you had a lower endoscopy (such as a colonoscopy or flexible sigmoidoscopy) you may notice spotting of blood in your stool or on the toilet paper. Some abdominal soreness may be present for a day or two, also.  DIET: Your first meal following the procedure should be a light meal and then it is ok to progress to your normal diet. A half-sandwich or bowl of soup is an example of a good first meal. Heavy or fried foods are harder to digest and may make you feel nauseous or bloated. Drink plenty of fluids but you should avoid alcoholic beverages for 24 hours. If you had a esophageal dilation, please see attached instructions for diet.   ACTIVITY: Your care partner should take you home directly after the procedure. You should plan to take it easy, moving slowly for the rest of the day. You can resume normal activity the day after the procedure however YOU SHOULD NOT DRIVE, use power tools, machinery or perform tasks that involve climbing or major physical exertion for 24 hours (because of the sedation medicines used during the test).   SYMPTOMS TO REPORT IMMEDIATELY: A gastroenterologist can  be reached at any hour. Please call [phone]  for any of the following symptoms:  Following lower endoscopy (colonoscopy, flexible sigmoidoscopy) Excessive amounts of blood in the stool  Significant tenderness, worsening of abdominal pains  Swelling of the abdomen that is new, acute  Fever of 100 or higher  Following upper endoscopy (EGD, EUS, ERCP, esophageal dilation) Vomiting of blood or coffee ground material  New, significant abdominal pain  New, significant chest pain or pain under the shoulder blades  Painful or persistently difficult swallowing  New shortness of breath  Black, tarry-looking or red, bloody stools  FOLLOW UP:  If any biopsies were taken you will be contacted by phone or by letter within the next 1-3 weeks. Call [phone]  if you have not heard about the biopsies in 3 weeks.  Please also call with any specific questions about appointments or follow up tests.

## 2016-12-13 ENCOUNTER — Encounter (HOSPITAL_COMMUNITY): Payer: Self-pay | Admitting: Gastroenterology

## 2016-12-17 ENCOUNTER — Other Ambulatory Visit: Payer: Self-pay | Admitting: Orthopedic Surgery

## 2016-12-17 DIAGNOSIS — M47816 Spondylosis without myelopathy or radiculopathy, lumbar region: Secondary | ICD-10-CM

## 2016-12-17 DIAGNOSIS — H401131 Primary open-angle glaucoma, bilateral, mild stage: Secondary | ICD-10-CM | POA: Diagnosis not present

## 2016-12-21 ENCOUNTER — Inpatient Hospital Stay
Admission: RE | Admit: 2016-12-21 | Discharge: 2016-12-21 | Disposition: A | Discharge status: Home / Self Care | Payer: Medicare HMO | Source: Ambulatory Visit | Attending: Orthopedic Surgery | Admitting: Orthopedic Surgery

## 2016-12-26 DIAGNOSIS — M5416 Radiculopathy, lumbar region: Secondary | ICD-10-CM | POA: Diagnosis not present

## 2016-12-26 DIAGNOSIS — R030 Elevated blood-pressure reading, without diagnosis of hypertension: Secondary | ICD-10-CM | POA: Diagnosis not present

## 2016-12-28 ENCOUNTER — Ambulatory Visit
Admission: RE | Admit: 2016-12-28 | Discharge: 2016-12-28 | Disposition: A | Discharge status: Home / Self Care | Payer: Medicare HMO | Source: Ambulatory Visit | Attending: Orthopedic Surgery | Admitting: Orthopedic Surgery

## 2016-12-28 DIAGNOSIS — M47816 Spondylosis without myelopathy or radiculopathy, lumbar region: Secondary | ICD-10-CM

## 2016-12-28 DIAGNOSIS — M533 Sacrococcygeal disorders, not elsewhere classified: Secondary | ICD-10-CM | POA: Diagnosis not present

## 2016-12-28 MED ORDER — METHYLPREDNISOLONE ACETATE 40 MG/ML INJ SUSP (RADIOLOG
120.0000 mg | Freq: Once | INTRAMUSCULAR | Status: AC
  Administered 2016-12-28: 120 mg via INTRA_ARTICULAR

## 2016-12-28 MED ORDER — IOPAMIDOL (ISOVUE-M 200) INJECTION 41%
1.0000 mL | Freq: Once | INTRAMUSCULAR | Status: AC
  Administered 2016-12-28: 1 mL via INTRA_ARTICULAR

## 2016-12-28 NOTE — Discharge Instructions (Signed)

## 2017-01-03 DIAGNOSIS — R300 Dysuria: Secondary | ICD-10-CM | POA: Diagnosis not present

## 2017-01-29 DIAGNOSIS — M5416 Radiculopathy, lumbar region: Secondary | ICD-10-CM | POA: Diagnosis not present

## 2017-03-18 DIAGNOSIS — H5203 Hypermetropia, bilateral: Secondary | ICD-10-CM | POA: Diagnosis not present

## 2017-03-28 NOTE — Addendum Note (Signed)
Addendum  created 03/28/17 1416 by Roberts Gaudy, MD   Sign clinical note

## 2017-04-02 DIAGNOSIS — Z85828 Personal history of other malignant neoplasm of skin: Secondary | ICD-10-CM | POA: Diagnosis not present

## 2017-04-02 DIAGNOSIS — D1801 Hemangioma of skin and subcutaneous tissue: Secondary | ICD-10-CM | POA: Diagnosis not present

## 2017-04-02 DIAGNOSIS — L814 Other melanin hyperpigmentation: Secondary | ICD-10-CM | POA: Diagnosis not present

## 2017-04-02 DIAGNOSIS — L821 Other seborrheic keratosis: Secondary | ICD-10-CM | POA: Diagnosis not present

## 2017-04-02 DIAGNOSIS — D225 Melanocytic nevi of trunk: Secondary | ICD-10-CM | POA: Diagnosis not present

## 2017-04-12 DIAGNOSIS — Z79899 Other long term (current) drug therapy: Secondary | ICD-10-CM | POA: Diagnosis not present

## 2017-04-12 DIAGNOSIS — R5382 Chronic fatigue, unspecified: Secondary | ICD-10-CM | POA: Diagnosis not present

## 2017-04-12 DIAGNOSIS — Z0000 Encounter for general adult medical examination without abnormal findings: Secondary | ICD-10-CM | POA: Diagnosis not present

## 2017-04-12 DIAGNOSIS — F322 Major depressive disorder, single episode, severe without psychotic features: Secondary | ICD-10-CM | POA: Diagnosis not present

## 2017-04-12 DIAGNOSIS — G4700 Insomnia, unspecified: Secondary | ICD-10-CM | POA: Diagnosis not present

## 2017-04-12 DIAGNOSIS — Z23 Encounter for immunization: Secondary | ICD-10-CM | POA: Diagnosis not present

## 2017-04-12 DIAGNOSIS — K30 Functional dyspepsia: Secondary | ICD-10-CM | POA: Diagnosis not present

## 2017-04-12 DIAGNOSIS — J309 Allergic rhinitis, unspecified: Secondary | ICD-10-CM | POA: Diagnosis not present

## 2017-05-22 DIAGNOSIS — Z0100 Encounter for examination of eyes and vision without abnormal findings: Secondary | ICD-10-CM | POA: Diagnosis not present

## 2017-06-14 ENCOUNTER — Other Ambulatory Visit: Payer: Self-pay | Admitting: Family Medicine

## 2017-06-14 DIAGNOSIS — Z1231 Encounter for screening mammogram for malignant neoplasm of breast: Secondary | ICD-10-CM

## 2017-07-19 ENCOUNTER — Ambulatory Visit
Admission: RE | Admit: 2017-07-19 | Discharge: 2017-07-19 | Disposition: A | Discharge status: Home / Self Care | Payer: Medicare HMO | Source: Ambulatory Visit | Attending: Family Medicine | Admitting: Family Medicine

## 2017-07-19 DIAGNOSIS — Z1231 Encounter for screening mammogram for malignant neoplasm of breast: Secondary | ICD-10-CM | POA: Diagnosis not present

## 2017-09-30 DIAGNOSIS — H2513 Age-related nuclear cataract, bilateral: Secondary | ICD-10-CM | POA: Diagnosis not present

## 2017-09-30 DIAGNOSIS — H401131 Primary open-angle glaucoma, bilateral, mild stage: Secondary | ICD-10-CM | POA: Diagnosis not present

## 2017-10-01 DIAGNOSIS — Z85828 Personal history of other malignant neoplasm of skin: Secondary | ICD-10-CM | POA: Diagnosis not present

## 2017-10-01 DIAGNOSIS — L821 Other seborrheic keratosis: Secondary | ICD-10-CM | POA: Diagnosis not present

## 2017-10-01 DIAGNOSIS — D1801 Hemangioma of skin and subcutaneous tissue: Secondary | ICD-10-CM | POA: Diagnosis not present

## 2017-10-01 DIAGNOSIS — L708 Other acne: Secondary | ICD-10-CM | POA: Diagnosis not present

## 2017-10-28 IMAGING — RF DG ERCP WO/W SPHINCTEROTOMY
1 series · 2 of 2 positions shown · non-contrast
Comparison: 11/24/2016 MR

CLINICAL DATA: Biliary dilatation, choledocholithiasis

EXAM:
ERCP sphincterotomy with balloon sweep of the bile duct.
TECHNIQUE: Multiple spot images obtained with the fluoroscopic device and
submitted for interpretation post-procedure.
FLUOROSCOPY TIME:  Fluoroscopy Time:  3 minutes 43 seconds

[Series 1: run · 2 of 2 slices shown]
[im 1/2]
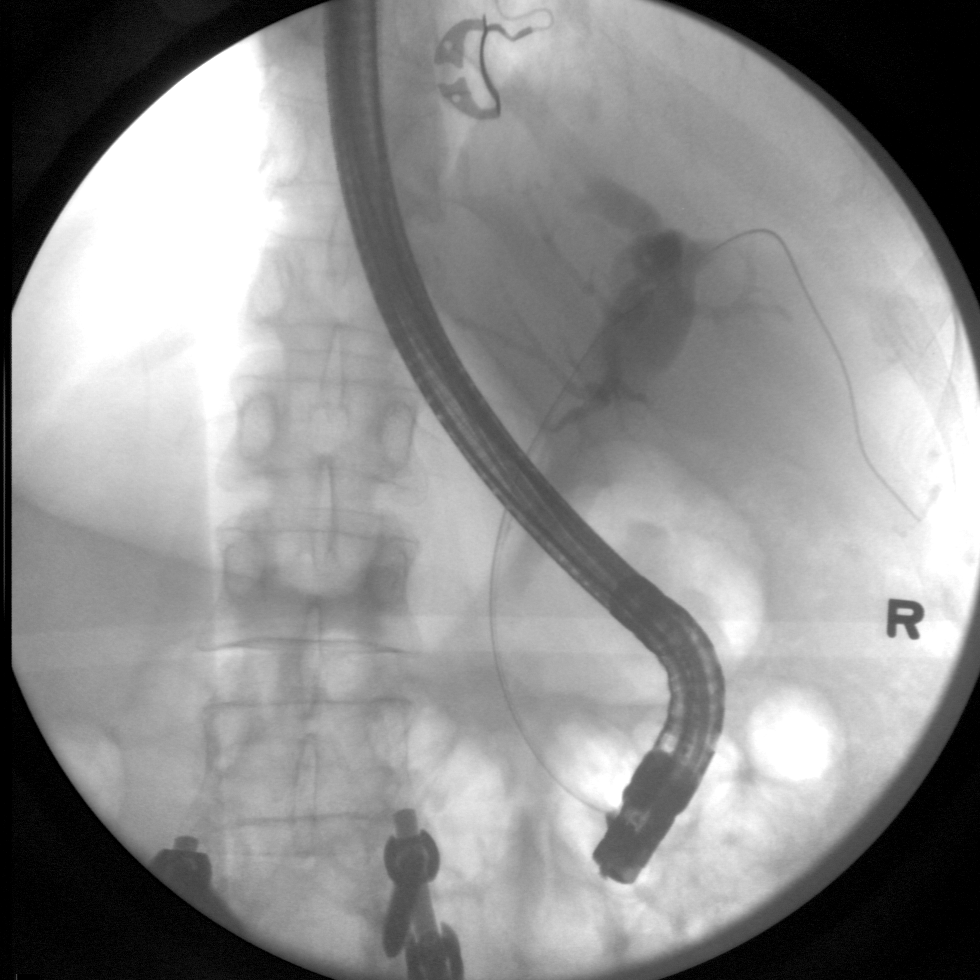
[im 2/2]
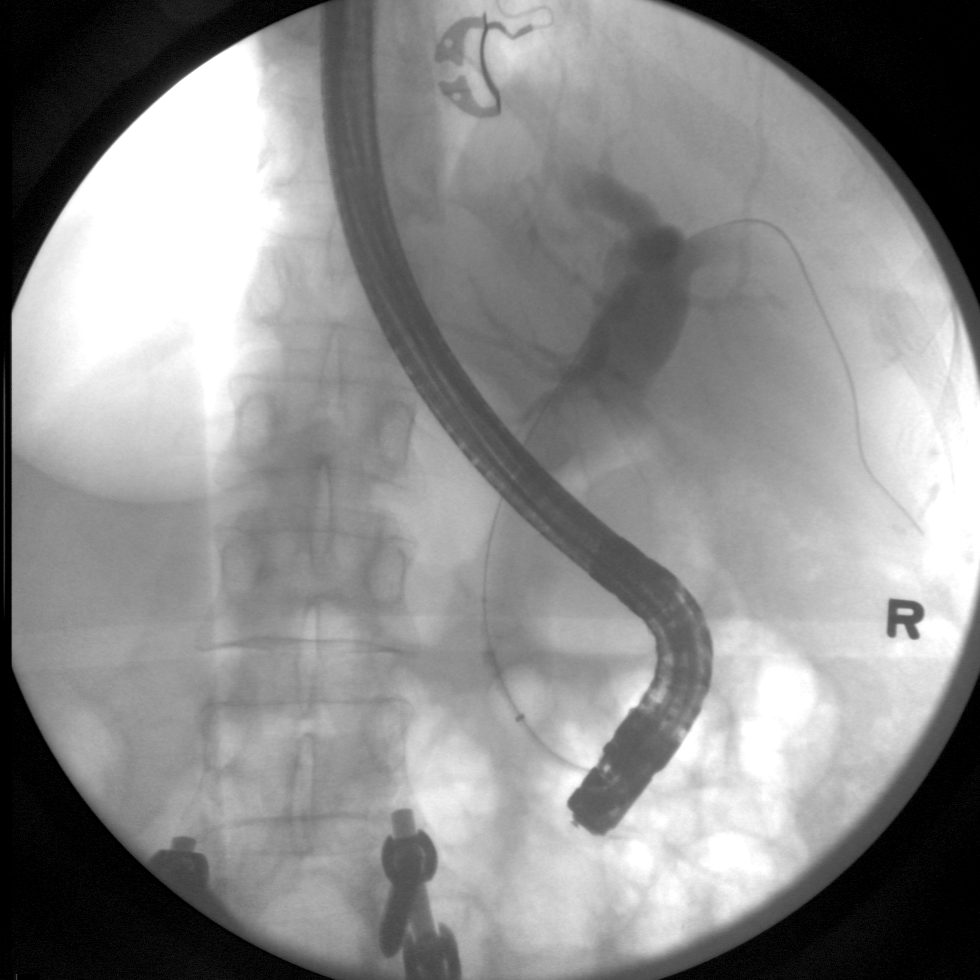

[2 of 2 positions shown; findings below may reference images not displayed]

FINDINGS: Spot fluoroscopic intraoperative views during procedure demonstrate
diffuse biliary dilatation. There is incomplete opacification of the
biliary tree. Guidewire access performed for balloon sweep of the
duct.
IMPRESSION: Limited imaging during the ERCP demonstrating balloon sweep of the
common bile duct.

These images were submitted for radiologic interpretation only.
Please see the procedural report for the amount of contrast and the
fluoroscopy time utilized.

## 2017-11-13 IMAGING — XA DG FLUORO GUIDE SPINAL/SI JT INJ*L*
1 series · 1 of 1 positions shown · non-contrast
Comparison: none

CLINICAL DATA: Suspected sacroiliac joint dysfunction. LEFT hip
pain. Previous L4-5 PLIF.

[Series 1: ortho standard · 1 of 1 slices shown]
[im 1/1]
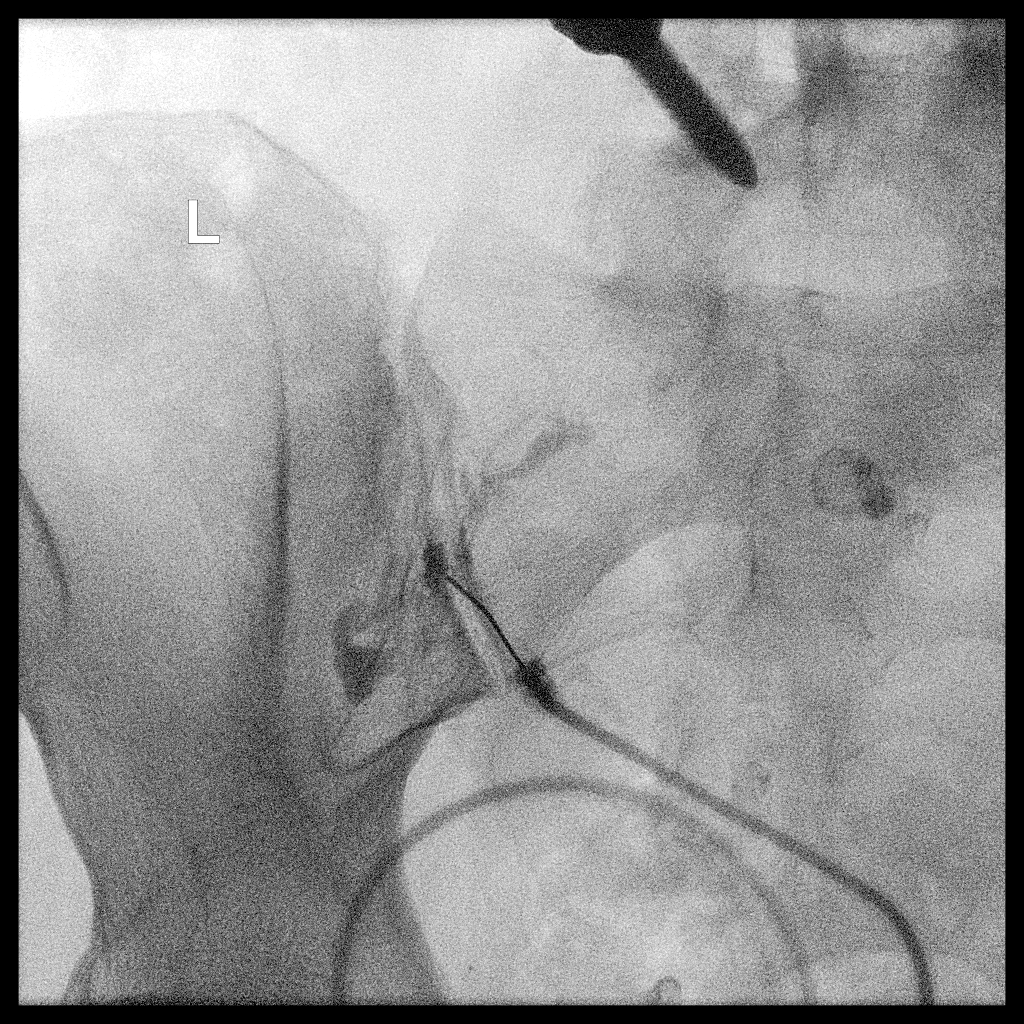

[1 of 1 positions shown; findings below may reference images not displayed]

CONTRAST:  1 mL Isovue M 200

FLUOROSCOPY TIME:  9 seconds corresponding to a Dose Area Product of
14 ?Gy*m2

PROCEDURE:
LEFT SI JOINT INJECTION.

After a thorough discussion of risks and benefits of the procedure,
including bleeding, infection, injury to nerves, blood vessels, and
adjacent structures, verbal and written consent was obtained.
Specific risks of the procedure included
nondiagnostic/nontherapeutic injection and non target injection. The
patient was placed prone on the fluoroscopy table and localization
was performed over the sacrum. Target sitemarked using fluoroscopic
guidance. The skin was prepped and draped in the usual sterile
fashion using Betadine soap.

After local anesthesia with 1% lidocaine without epinephrine and
subsequent deep anesthesia, a 20 gauge 3.5 inch spinal needle was
advanced into the LEFT SI joint. Injection of 1.0 ml Isovue-M 200
confirmed intra-articular placement. No vascular uptake present.
Subsequently, 120 mg of Depo-Medrol along with 2 mL 1% lidocaine was
injected into SI joint. Needles removed and a sterile dressing
applied.

No complications were observed. The patient was observed and
released under the care of a driver after 30 minutes.
IMPRESSION: Successful fluoroscopically guided LEFT SI joint injection.

## 2018-01-02 DIAGNOSIS — T1511XA Foreign body in conjunctival sac, right eye, initial encounter: Secondary | ICD-10-CM | POA: Diagnosis not present

## 2018-01-28 ENCOUNTER — Other Ambulatory Visit: Payer: Self-pay | Admitting: Gastroenterology

## 2018-01-28 DIAGNOSIS — R1011 Right upper quadrant pain: Secondary | ICD-10-CM | POA: Diagnosis not present

## 2018-01-28 DIAGNOSIS — K838 Other specified diseases of biliary tract: Secondary | ICD-10-CM | POA: Diagnosis not present

## 2018-01-28 DIAGNOSIS — R932 Abnormal findings on diagnostic imaging of liver and biliary tract: Secondary | ICD-10-CM | POA: Diagnosis not present

## 2018-01-29 ENCOUNTER — Ambulatory Visit
Admission: RE | Admit: 2018-01-29 | Discharge: 2018-01-29 | Disposition: A | Discharge status: Home / Self Care | Payer: Medicare HMO | Source: Ambulatory Visit | Attending: Gastroenterology | Admitting: Gastroenterology

## 2018-01-29 DIAGNOSIS — R1011 Right upper quadrant pain: Secondary | ICD-10-CM | POA: Diagnosis not present

## 2018-01-29 DIAGNOSIS — K8020 Calculus of gallbladder without cholecystitis without obstruction: Secondary | ICD-10-CM | POA: Diagnosis not present

## 2018-02-03 DIAGNOSIS — K8050 Calculus of bile duct without cholangitis or cholecystitis without obstruction: Secondary | ICD-10-CM | POA: Diagnosis not present

## 2018-02-14 DIAGNOSIS — H401131 Primary open-angle glaucoma, bilateral, mild stage: Secondary | ICD-10-CM | POA: Diagnosis not present

## 2018-03-04 DIAGNOSIS — K8066 Calculus of gallbladder and bile duct with acute and chronic cholecystitis without obstruction: Secondary | ICD-10-CM | POA: Diagnosis not present

## 2018-03-04 DIAGNOSIS — K8050 Calculus of bile duct without cholangitis or cholecystitis without obstruction: Secondary | ICD-10-CM | POA: Diagnosis not present

## 2018-03-04 DIAGNOSIS — K8012 Calculus of gallbladder with acute and chronic cholecystitis without obstruction: Secondary | ICD-10-CM | POA: Diagnosis not present

## 2018-03-10 DIAGNOSIS — K5901 Slow transit constipation: Secondary | ICD-10-CM | POA: Diagnosis not present

## 2018-03-10 DIAGNOSIS — J029 Acute pharyngitis, unspecified: Secondary | ICD-10-CM | POA: Diagnosis not present

## 2018-03-31 DIAGNOSIS — H401131 Primary open-angle glaucoma, bilateral, mild stage: Secondary | ICD-10-CM | POA: Diagnosis not present

## 2018-04-16 DIAGNOSIS — H5203 Hypermetropia, bilateral: Secondary | ICD-10-CM | POA: Diagnosis not present

## 2018-04-20 ENCOUNTER — Telehealth: Payer: Self-pay | Admitting: Surgery

## 2018-04-20 NOTE — Telephone Encounter (Signed)
Ms. Bolte had a lap chole by Dr. Rosendo Gros on 03/04/2018 at [facility] (?).  She has drainage from her umbilical incision.  She is going to wash and keep the area clean. She will call the office tomorrow for follow up with Dr. Rosendo Gros.  Alphonsa Overall, MD, [facility] Pager: [phone] Office phone:  [phone]

## 2018-05-02 DIAGNOSIS — F325 Major depressive disorder, single episode, in full remission: Secondary | ICD-10-CM | POA: Diagnosis not present

## 2018-05-02 DIAGNOSIS — G4700 Insomnia, unspecified: Secondary | ICD-10-CM | POA: Diagnosis not present

## 2018-05-02 DIAGNOSIS — K30 Functional dyspepsia: Secondary | ICD-10-CM | POA: Diagnosis not present

## 2018-05-02 DIAGNOSIS — Z23 Encounter for immunization: Secondary | ICD-10-CM | POA: Diagnosis not present

## 2018-05-02 DIAGNOSIS — Z0000 Encounter for general adult medical examination without abnormal findings: Secondary | ICD-10-CM | POA: Diagnosis not present

## 2018-05-02 DIAGNOSIS — F322 Major depressive disorder, single episode, severe without psychotic features: Secondary | ICD-10-CM | POA: Diagnosis not present

## 2018-05-02 DIAGNOSIS — Z79899 Other long term (current) drug therapy: Secondary | ICD-10-CM | POA: Diagnosis not present

## 2018-05-02 DIAGNOSIS — Z13220 Encounter for screening for lipoid disorders: Secondary | ICD-10-CM | POA: Diagnosis not present

## 2018-05-02 DIAGNOSIS — J309 Allergic rhinitis, unspecified: Secondary | ICD-10-CM | POA: Diagnosis not present

## 2018-05-02 DIAGNOSIS — T8189XA Other complications of procedures, not elsewhere classified, initial encounter: Secondary | ICD-10-CM | POA: Diagnosis not present

## 2018-05-13 DIAGNOSIS — T8189XA Other complications of procedures, not elsewhere classified, initial encounter: Secondary | ICD-10-CM | POA: Diagnosis not present

## 2018-05-13 DIAGNOSIS — L578 Other skin changes due to chronic exposure to nonionizing radiation: Secondary | ICD-10-CM | POA: Diagnosis not present

## 2018-05-13 DIAGNOSIS — Z85828 Personal history of other malignant neoplasm of skin: Secondary | ICD-10-CM | POA: Diagnosis not present

## 2018-05-13 DIAGNOSIS — D1801 Hemangioma of skin and subcutaneous tissue: Secondary | ICD-10-CM | POA: Diagnosis not present

## 2018-06-16 ENCOUNTER — Other Ambulatory Visit: Payer: Self-pay | Admitting: Family Medicine

## 2018-06-16 DIAGNOSIS — Z1231 Encounter for screening mammogram for malignant neoplasm of breast: Secondary | ICD-10-CM

## 2018-06-23 DIAGNOSIS — R300 Dysuria: Secondary | ICD-10-CM | POA: Diagnosis not present

## 2018-06-27 DIAGNOSIS — Z0100 Encounter for examination of eyes and vision without abnormal findings: Secondary | ICD-10-CM | POA: Diagnosis not present

## 2018-07-02 DIAGNOSIS — N390 Urinary tract infection, site not specified: Secondary | ICD-10-CM | POA: Diagnosis not present

## 2018-07-28 ENCOUNTER — Ambulatory Visit
Admission: RE | Admit: 2018-07-28 | Discharge: 2018-07-28 | Disposition: A | Discharge status: Home / Self Care | Payer: Medicare HMO | Source: Ambulatory Visit | Attending: Family Medicine | Admitting: Family Medicine

## 2018-07-28 DIAGNOSIS — Z1231 Encounter for screening mammogram for malignant neoplasm of breast: Secondary | ICD-10-CM

## 2018-08-29 DIAGNOSIS — Q385 Congenital malformations of palate, not elsewhere classified: Secondary | ICD-10-CM | POA: Diagnosis not present

## 2018-08-29 DIAGNOSIS — N362 Urethral caruncle: Secondary | ICD-10-CM | POA: Diagnosis not present

## 2018-09-09 DIAGNOSIS — H401131 Primary open-angle glaucoma, bilateral, mild stage: Secondary | ICD-10-CM | POA: Diagnosis not present

## 2018-12-02 DIAGNOSIS — R310 Gross hematuria: Secondary | ICD-10-CM | POA: Diagnosis not present

## 2018-12-02 DIAGNOSIS — N3942 Incontinence without sensory awareness: Secondary | ICD-10-CM | POA: Diagnosis not present

## 2018-12-09 DIAGNOSIS — R310 Gross hematuria: Secondary | ICD-10-CM | POA: Diagnosis not present

## 2018-12-16 DIAGNOSIS — R310 Gross hematuria: Secondary | ICD-10-CM | POA: Diagnosis not present

## 2018-12-16 DIAGNOSIS — D304 Benign neoplasm of urethra: Secondary | ICD-10-CM | POA: Diagnosis not present

## 2019-01-07 DIAGNOSIS — H401131 Primary open-angle glaucoma, bilateral, mild stage: Secondary | ICD-10-CM | POA: Diagnosis not present

## 2019-04-27 DIAGNOSIS — H401131 Primary open-angle glaucoma, bilateral, mild stage: Secondary | ICD-10-CM | POA: Diagnosis not present

## 2019-05-09 DIAGNOSIS — Z23 Encounter for immunization: Secondary | ICD-10-CM | POA: Diagnosis not present

## 2019-05-12 DIAGNOSIS — R5382 Chronic fatigue, unspecified: Secondary | ICD-10-CM | POA: Diagnosis not present

## 2019-05-15 DIAGNOSIS — J309 Allergic rhinitis, unspecified: Secondary | ICD-10-CM | POA: Diagnosis not present

## 2019-05-15 DIAGNOSIS — G4700 Insomnia, unspecified: Secondary | ICD-10-CM | POA: Diagnosis not present

## 2019-05-15 DIAGNOSIS — Z79899 Other long term (current) drug therapy: Secondary | ICD-10-CM | POA: Diagnosis not present

## 2019-05-15 DIAGNOSIS — Z7189 Other specified counseling: Secondary | ICD-10-CM | POA: Diagnosis not present

## 2019-05-15 DIAGNOSIS — R102 Pelvic and perineal pain: Secondary | ICD-10-CM | POA: Diagnosis not present

## 2019-05-15 DIAGNOSIS — F322 Major depressive disorder, single episode, severe without psychotic features: Secondary | ICD-10-CM | POA: Diagnosis not present

## 2019-05-15 DIAGNOSIS — Z0000 Encounter for general adult medical examination without abnormal findings: Secondary | ICD-10-CM | POA: Diagnosis not present

## 2019-05-18 ENCOUNTER — Other Ambulatory Visit: Payer: Self-pay | Admitting: Family Medicine

## 2019-05-18 DIAGNOSIS — R1032 Left lower quadrant pain: Secondary | ICD-10-CM

## 2019-05-19 ENCOUNTER — Ambulatory Visit
Admission: RE | Admit: 2019-05-19 | Discharge: 2019-05-19 | Disposition: A | Discharge status: Home / Self Care | Payer: Medicare HMO | Source: Ambulatory Visit | Attending: Family Medicine | Admitting: Family Medicine

## 2019-05-19 DIAGNOSIS — R1032 Left lower quadrant pain: Secondary | ICD-10-CM

## 2019-05-20 DIAGNOSIS — L728 Other follicular cysts of the skin and subcutaneous tissue: Secondary | ICD-10-CM | POA: Diagnosis not present

## 2019-05-20 DIAGNOSIS — L239 Allergic contact dermatitis, unspecified cause: Secondary | ICD-10-CM | POA: Diagnosis not present

## 2019-05-20 DIAGNOSIS — L718 Other rosacea: Secondary | ICD-10-CM | POA: Diagnosis not present

## 2019-05-20 DIAGNOSIS — D1801 Hemangioma of skin and subcutaneous tissue: Secondary | ICD-10-CM | POA: Diagnosis not present

## 2019-05-20 DIAGNOSIS — L814 Other melanin hyperpigmentation: Secondary | ICD-10-CM | POA: Diagnosis not present

## 2019-05-20 DIAGNOSIS — Z85828 Personal history of other malignant neoplasm of skin: Secondary | ICD-10-CM | POA: Diagnosis not present

## 2019-05-20 DIAGNOSIS — D225 Melanocytic nevi of trunk: Secondary | ICD-10-CM | POA: Diagnosis not present

## 2019-06-24 ENCOUNTER — Other Ambulatory Visit: Payer: Self-pay | Admitting: Family Medicine

## 2019-06-24 DIAGNOSIS — Z1231 Encounter for screening mammogram for malignant neoplasm of breast: Secondary | ICD-10-CM

## 2019-07-09 DIAGNOSIS — Z0100 Encounter for examination of eyes and vision without abnormal findings: Secondary | ICD-10-CM | POA: Diagnosis not present

## 2019-07-27 DIAGNOSIS — H401131 Primary open-angle glaucoma, bilateral, mild stage: Secondary | ICD-10-CM | POA: Diagnosis not present

## 2019-08-17 ENCOUNTER — Ambulatory Visit
Admission: RE | Admit: 2019-08-17 | Discharge: 2019-08-17 | Disposition: A | Discharge status: Home / Self Care | Payer: Medicare HMO | Source: Ambulatory Visit | Attending: Family Medicine | Admitting: Family Medicine

## 2019-08-17 ENCOUNTER — Other Ambulatory Visit: Payer: Self-pay

## 2019-08-17 DIAGNOSIS — Z1231 Encounter for screening mammogram for malignant neoplasm of breast: Secondary | ICD-10-CM

## 2019-11-17 DIAGNOSIS — H401131 Primary open-angle glaucoma, bilateral, mild stage: Secondary | ICD-10-CM | POA: Diagnosis not present

## 2019-12-29 DIAGNOSIS — H2513 Age-related nuclear cataract, bilateral: Secondary | ICD-10-CM | POA: Diagnosis not present

## 2019-12-29 DIAGNOSIS — H402234 Chronic angle-closure glaucoma, bilateral, indeterminate stage: Secondary | ICD-10-CM | POA: Diagnosis not present

## 2019-12-29 DIAGNOSIS — H401134 Primary open-angle glaucoma, bilateral, indeterminate stage: Secondary | ICD-10-CM | POA: Diagnosis not present

## 2020-01-01 DIAGNOSIS — H2513 Age-related nuclear cataract, bilateral: Secondary | ICD-10-CM | POA: Diagnosis not present

## 2020-01-01 DIAGNOSIS — H401134 Primary open-angle glaucoma, bilateral, indeterminate stage: Secondary | ICD-10-CM | POA: Diagnosis not present

## 2020-01-22 DIAGNOSIS — H401134 Primary open-angle glaucoma, bilateral, indeterminate stage: Secondary | ICD-10-CM | POA: Diagnosis not present

## 2020-01-22 DIAGNOSIS — H2513 Age-related nuclear cataract, bilateral: Secondary | ICD-10-CM | POA: Diagnosis not present

## 2020-01-29 DIAGNOSIS — H02831 Dermatochalasis of right upper eyelid: Secondary | ICD-10-CM | POA: Diagnosis not present

## 2020-01-29 DIAGNOSIS — H2513 Age-related nuclear cataract, bilateral: Secondary | ICD-10-CM | POA: Diagnosis not present

## 2020-01-29 DIAGNOSIS — H02834 Dermatochalasis of left upper eyelid: Secondary | ICD-10-CM | POA: Diagnosis not present

## 2020-01-29 DIAGNOSIS — H401134 Primary open-angle glaucoma, bilateral, indeterminate stage: Secondary | ICD-10-CM | POA: Diagnosis not present

## 2020-02-03 DIAGNOSIS — H02834 Dermatochalasis of left upper eyelid: Secondary | ICD-10-CM | POA: Diagnosis not present

## 2020-02-03 DIAGNOSIS — H02831 Dermatochalasis of right upper eyelid: Secondary | ICD-10-CM | POA: Diagnosis not present

## 2020-02-03 DIAGNOSIS — H401134 Primary open-angle glaucoma, bilateral, indeterminate stage: Secondary | ICD-10-CM | POA: Diagnosis not present

## 2020-02-03 DIAGNOSIS — H2513 Age-related nuclear cataract, bilateral: Secondary | ICD-10-CM | POA: Diagnosis not present

## 2020-02-19 DIAGNOSIS — R300 Dysuria: Secondary | ICD-10-CM | POA: Diagnosis not present

## 2020-03-03 DIAGNOSIS — H401111 Primary open-angle glaucoma, right eye, mild stage: Secondary | ICD-10-CM | POA: Diagnosis not present

## 2020-03-03 DIAGNOSIS — H2511 Age-related nuclear cataract, right eye: Secondary | ICD-10-CM | POA: Diagnosis not present

## 2020-04-11 DIAGNOSIS — H2512 Age-related nuclear cataract, left eye: Secondary | ICD-10-CM | POA: Diagnosis not present

## 2020-04-14 DIAGNOSIS — H2512 Age-related nuclear cataract, left eye: Secondary | ICD-10-CM | POA: Diagnosis not present

## 2020-04-14 DIAGNOSIS — H401121 Primary open-angle glaucoma, left eye, mild stage: Secondary | ICD-10-CM | POA: Diagnosis not present

## 2020-04-15 DIAGNOSIS — R300 Dysuria: Secondary | ICD-10-CM | POA: Diagnosis not present

## 2020-05-03 DIAGNOSIS — Z23 Encounter for immunization: Secondary | ICD-10-CM | POA: Diagnosis not present

## 2020-05-03 DIAGNOSIS — N390 Urinary tract infection, site not specified: Secondary | ICD-10-CM | POA: Diagnosis not present

## 2020-05-30 DIAGNOSIS — C44519 Basal cell carcinoma of skin of other part of trunk: Secondary | ICD-10-CM | POA: Diagnosis not present

## 2020-05-30 DIAGNOSIS — D485 Neoplasm of uncertain behavior of skin: Secondary | ICD-10-CM | POA: Diagnosis not present

## 2020-05-30 DIAGNOSIS — L905 Scar conditions and fibrosis of skin: Secondary | ICD-10-CM | POA: Diagnosis not present

## 2020-05-30 DIAGNOSIS — L814 Other melanin hyperpigmentation: Secondary | ICD-10-CM | POA: Diagnosis not present

## 2020-05-30 DIAGNOSIS — D225 Melanocytic nevi of trunk: Secondary | ICD-10-CM | POA: Diagnosis not present

## 2020-05-30 DIAGNOSIS — L821 Other seborrheic keratosis: Secondary | ICD-10-CM | POA: Diagnosis not present

## 2020-05-30 DIAGNOSIS — D1801 Hemangioma of skin and subcutaneous tissue: Secondary | ICD-10-CM | POA: Diagnosis not present

## 2020-05-30 DIAGNOSIS — Z85828 Personal history of other malignant neoplasm of skin: Secondary | ICD-10-CM | POA: Diagnosis not present

## 2020-06-01 DIAGNOSIS — Z7189 Other specified counseling: Secondary | ICD-10-CM | POA: Diagnosis not present

## 2020-06-01 DIAGNOSIS — J309 Allergic rhinitis, unspecified: Secondary | ICD-10-CM | POA: Diagnosis not present

## 2020-06-01 DIAGNOSIS — H409 Unspecified glaucoma: Secondary | ICD-10-CM | POA: Diagnosis not present

## 2020-06-01 DIAGNOSIS — Z0000 Encounter for general adult medical examination without abnormal findings: Secondary | ICD-10-CM | POA: Diagnosis not present

## 2020-06-01 DIAGNOSIS — G4700 Insomnia, unspecified: Secondary | ICD-10-CM | POA: Diagnosis not present

## 2020-06-01 DIAGNOSIS — Z79899 Other long term (current) drug therapy: Secondary | ICD-10-CM | POA: Diagnosis not present

## 2020-06-01 DIAGNOSIS — F322 Major depressive disorder, single episode, severe without psychotic features: Secondary | ICD-10-CM | POA: Diagnosis not present

## 2020-06-15 ENCOUNTER — Other Ambulatory Visit: Payer: Self-pay | Admitting: Family Medicine

## 2020-06-15 DIAGNOSIS — R5381 Other malaise: Secondary | ICD-10-CM

## 2020-06-27 DIAGNOSIS — D304 Benign neoplasm of urethra: Secondary | ICD-10-CM | POA: Diagnosis not present

## 2020-06-27 DIAGNOSIS — R300 Dysuria: Secondary | ICD-10-CM | POA: Diagnosis not present

## 2020-07-05 DIAGNOSIS — C44519 Basal cell carcinoma of skin of other part of trunk: Secondary | ICD-10-CM | POA: Diagnosis not present

## 2020-07-05 DIAGNOSIS — L905 Scar conditions and fibrosis of skin: Secondary | ICD-10-CM | POA: Diagnosis not present

## 2020-07-08 ENCOUNTER — Other Ambulatory Visit: Payer: Self-pay | Admitting: Family Medicine

## 2020-07-08 DIAGNOSIS — Z780 Asymptomatic menopausal state: Secondary | ICD-10-CM

## 2020-07-18 ENCOUNTER — Other Ambulatory Visit: Payer: Self-pay | Admitting: Family Medicine

## 2020-07-18 DIAGNOSIS — Z1231 Encounter for screening mammogram for malignant neoplasm of breast: Secondary | ICD-10-CM

## 2020-08-09 DIAGNOSIS — N3020 Other chronic cystitis without hematuria: Secondary | ICD-10-CM | POA: Diagnosis not present

## 2020-08-23 DIAGNOSIS — R21 Rash and other nonspecific skin eruption: Secondary | ICD-10-CM | POA: Diagnosis not present

## 2020-08-29 ENCOUNTER — Ambulatory Visit
Admission: RE | Admit: 2020-08-29 | Discharge: 2020-08-29 | Disposition: A | Discharge status: Home / Self Care | Payer: Medicare HMO | Source: Ambulatory Visit | Attending: Family Medicine | Admitting: Family Medicine

## 2020-08-29 ENCOUNTER — Other Ambulatory Visit: Payer: Self-pay

## 2020-08-29 DIAGNOSIS — Z1231 Encounter for screening mammogram for malignant neoplasm of breast: Secondary | ICD-10-CM

## 2020-09-10 DIAGNOSIS — J069 Acute upper respiratory infection, unspecified: Secondary | ICD-10-CM | POA: Diagnosis not present

## 2020-10-14 ENCOUNTER — Other Ambulatory Visit: Payer: Self-pay | Admitting: Family Medicine

## 2020-10-14 DIAGNOSIS — Z780 Asymptomatic menopausal state: Secondary | ICD-10-CM

## 2020-10-14 DIAGNOSIS — E2839 Other primary ovarian failure: Secondary | ICD-10-CM

## 2020-10-17 ENCOUNTER — Other Ambulatory Visit: Payer: Medicare HMO

## 2020-10-19 DIAGNOSIS — H26492 Other secondary cataract, left eye: Secondary | ICD-10-CM | POA: Diagnosis not present

## 2020-10-19 DIAGNOSIS — H401131 Primary open-angle glaucoma, bilateral, mild stage: Secondary | ICD-10-CM | POA: Diagnosis not present

## 2020-10-19 DIAGNOSIS — Z961 Presence of intraocular lens: Secondary | ICD-10-CM | POA: Diagnosis not present

## 2020-10-19 DIAGNOSIS — H02834 Dermatochalasis of left upper eyelid: Secondary | ICD-10-CM | POA: Diagnosis not present

## 2020-10-19 DIAGNOSIS — H02831 Dermatochalasis of right upper eyelid: Secondary | ICD-10-CM | POA: Diagnosis not present

## 2020-11-08 DIAGNOSIS — N3020 Other chronic cystitis without hematuria: Secondary | ICD-10-CM | POA: Diagnosis not present

## 2020-11-11 DIAGNOSIS — L718 Other rosacea: Secondary | ICD-10-CM | POA: Diagnosis not present

## 2020-11-14 DIAGNOSIS — H524 Presbyopia: Secondary | ICD-10-CM | POA: Diagnosis not present

## 2020-11-14 DIAGNOSIS — H52223 Regular astigmatism, bilateral: Secondary | ICD-10-CM | POA: Diagnosis not present

## 2021-01-09 DIAGNOSIS — R232 Flushing: Secondary | ICD-10-CM | POA: Diagnosis not present

## 2021-01-13 DIAGNOSIS — L821 Other seborrheic keratosis: Secondary | ICD-10-CM | POA: Diagnosis not present

## 2021-01-13 DIAGNOSIS — L578 Other skin changes due to chronic exposure to nonionizing radiation: Secondary | ICD-10-CM | POA: Diagnosis not present

## 2021-01-13 DIAGNOSIS — L814 Other melanin hyperpigmentation: Secondary | ICD-10-CM | POA: Diagnosis not present

## 2021-01-13 DIAGNOSIS — L718 Other rosacea: Secondary | ICD-10-CM | POA: Diagnosis not present

## 2021-01-13 DIAGNOSIS — L218 Other seborrheic dermatitis: Secondary | ICD-10-CM | POA: Diagnosis not present

## 2021-01-13 DIAGNOSIS — L820 Inflamed seborrheic keratosis: Secondary | ICD-10-CM | POA: Diagnosis not present

## 2021-01-13 DIAGNOSIS — D1801 Hemangioma of skin and subcutaneous tissue: Secondary | ICD-10-CM | POA: Diagnosis not present

## 2021-01-30 DIAGNOSIS — M25562 Pain in left knee: Secondary | ICD-10-CM | POA: Diagnosis not present

## 2021-01-30 DIAGNOSIS — M79672 Pain in left foot: Secondary | ICD-10-CM | POA: Diagnosis not present

## 2021-02-15 DIAGNOSIS — J069 Acute upper respiratory infection, unspecified: Secondary | ICD-10-CM | POA: Diagnosis not present

## 2021-02-15 DIAGNOSIS — U071 COVID-19: Secondary | ICD-10-CM | POA: Diagnosis not present

## 2021-02-15 DIAGNOSIS — R509 Fever, unspecified: Secondary | ICD-10-CM | POA: Diagnosis not present

## 2021-02-15 DIAGNOSIS — R059 Cough, unspecified: Secondary | ICD-10-CM | POA: Diagnosis not present

## 2021-03-15 DIAGNOSIS — M2022 Hallux rigidus, left foot: Secondary | ICD-10-CM | POA: Diagnosis not present

## 2021-03-15 DIAGNOSIS — M7742 Metatarsalgia, left foot: Secondary | ICD-10-CM | POA: Diagnosis not present

## 2021-03-15 DIAGNOSIS — Z978 Presence of other specified devices: Secondary | ICD-10-CM | POA: Diagnosis not present

## 2021-04-11 ENCOUNTER — Other Ambulatory Visit: Payer: Medicare HMO

## 2021-04-17 DIAGNOSIS — Z961 Presence of intraocular lens: Secondary | ICD-10-CM | POA: Diagnosis not present

## 2021-04-17 DIAGNOSIS — H401131 Primary open-angle glaucoma, bilateral, mild stage: Secondary | ICD-10-CM | POA: Diagnosis not present

## 2021-04-17 DIAGNOSIS — H02831 Dermatochalasis of right upper eyelid: Secondary | ICD-10-CM | POA: Diagnosis not present

## 2021-04-17 DIAGNOSIS — H02834 Dermatochalasis of left upper eyelid: Secondary | ICD-10-CM | POA: Diagnosis not present

## 2021-05-08 ENCOUNTER — Ambulatory Visit
Admission: EM | Admit: 2021-05-08 | Discharge: 2021-05-08 | Disposition: A | Discharge status: Home / Self Care | Payer: Medicare HMO | Attending: Internal Medicine | Admitting: Internal Medicine

## 2021-05-08 ENCOUNTER — Other Ambulatory Visit: Payer: Self-pay

## 2021-05-08 ENCOUNTER — Encounter: Payer: Self-pay | Admitting: Emergency Medicine

## 2021-05-08 DIAGNOSIS — L258 Unspecified contact dermatitis due to other agents: Secondary | ICD-10-CM | POA: Diagnosis not present

## 2021-05-08 DIAGNOSIS — R21 Rash and other nonspecific skin eruption: Secondary | ICD-10-CM | POA: Diagnosis not present

## 2021-05-08 MED ORDER — TRIAMCINOLONE ACETONIDE 0.1 % EX CREA: 1.0000 "application " | TOPICAL_CREAM | Freq: Two times a day (BID) | CUTANEOUS | 0 refills | Status: DC

## 2021-05-08 MED ORDER — LORATADINE 10 MG PO TABS: 10.0000 mg | ORAL_TABLET | Freq: Every day | ORAL | 0 refills | Status: AC

## 2021-05-08 NOTE — Discharge Instructions (Addendum)
You have been prescribed triamcinolone steroid cream and Claritin to help alleviate rash.  Please follow-up with primary care physician or urgent care if symptoms persist or worsen.

## 2021-05-08 NOTE — ED Provider Notes (Addendum)
[facility]    CSN: [id number] Arrival date & time: 05/08/21  1151      History   Chief Complaint Chief Complaint  Patient presents with   Rash    HPI Eileen Simpson is a 80 y.o. female.   Patient presents with rash to left lower extremity that has been present for approximately 2 to 3 days.  Patient reports that blistering developed yesterday and is currently draining on its own.  Rash is itchy and slightly tender.  Denies any fevers.  Patient reports that she was trimming bushes prior to rash starting and thinks she may have been exposed to poison ivy.  Denies any changes to lotions, soaps, detergents, foods, etc.   Rash  Past Medical History:  Diagnosis Date   Complication of anesthesia    " took a longer time to wake up with shoulder surgery"   Gallstone    Glaucoma    Headache    History of hiatal hernia    Pneumonia    Wears glasses    and contact lenses   Wears partial dentures     There are no problems to display for this patient.   Past Surgical History:  Procedure Laterality Date   ABDOMINAL HYSTERECTOMY     partial   BACK SURGERY     lumbar fusion   COLONOSCOPY     DILATION AND CURETTAGE OF UTERUS     ERCP N/A 12/12/2016   Procedure: ENDOSCOPIC RETROGRADE CHOLANGIOPANCREATOGRAPHY (ERCP);  Surgeon: Clarene Essex, MD;  Location: [place];  Service: Endoscopy;  Laterality: N/A;   FOOT SURGERY     MULTIPLE TOOTH EXTRACTIONS     SHOULDER ARTHROSCOPY     right   TONSILLECTOMY      OB History   No obstetric history on file.      Home Medications    Prior to Admission medications   Medication Sig Start Date End Date Taking? Authorizing Provider  loratadine (CLARITIN) 10 MG tablet Take 1 tablet (10 mg total) by mouth daily. 05/08/21  Yes Odis Luster, FNP  triamcinolone cream (KENALOG) 0.1 % Apply 1 application topically 2 (two) times daily. 05/08/21  Yes Odis Luster, FNP  aspirin EC 325 MG tablet Take 325 mg by mouth  2 (two) times daily.      [provider]  aspirin EC 81 MG tablet Take 81 mg by mouth daily.    [provider]  bimatoprost (LUMIGAN) 0.03 % ophthalmic solution Place 1 drop into both eyes at bedtime.      [provider]  Calcium Carbonate-Vitamin D (CALCIUM + D PO) Take 1 tablet by mouth daily.      [provider]  diazepam (VALIUM) 5 MG tablet Take 5 mg by mouth as needed for anxiety.    [provider]  dorzolamide (TRUSOPT) 2 % ophthalmic solution Place 1 drop into both eyes 2 (two) times daily.    [provider]  gabapentin (NEURONTIN) 100 MG capsule Take 300 mg by mouth at bedtime.      [provider]  HYDROcodone-acetaminophen (VICODIN) 5-500 MG per tablet Take 1 tablet by mouth every 6 (six) hours as needed.      [provider]  latanoprost (XALATAN) 0.005 % ophthalmic solution Place 1 drop into both eyes at bedtime.    [provider]  methocarbamol (ROBAXIN) 500 MG tablet Take 500 mg by mouth every 8 (eight) hours.     [provider]  Multiple Vitamins-Minerals (MULTIVITAMINS THER. W/MINERALS) TABS Take 1 tablet by mouth daily.      [provider]  saccharomyces boulardii (FLORASTOR) 250 MG capsule Take 250 mg by mouth once.    [provider]  timolol (BETIMOL) 0.5 % ophthalmic solution Place 1 drop into both eyes 2 (two) times daily.      [provider]  vitamin C (ASCORBIC ACID) 500 MG tablet Take 500 mg by mouth daily.      [provider]    Family History Family History  Problem Relation Age of Onset   Hypertension Mother    Dementia Mother    Hypertension Father    Bladder Cancer Father    COPD Sister    Emphysema Brother    Breast cancer Maternal Aunt     Social History Social History   Tobacco Use   Smoking status: Former    Types: Cigarettes   Smokeless tobacco: Never  Vaping Use   Vaping Use: Never used  Substance Use Topics    Alcohol use: Yes    Comment: occassional   Drug use: No     Allergies   Codeine   Review of Systems Review of Systems Per HPI  Physical Exam Triage Vital Signs ED Triage Vitals [05/08/21 1508]  Enc Vitals Group     BP (!) 155/84     Pulse Rate 64     Resp 16     Temp 97.9 F (36.6 C)     Temp Source Oral     SpO2 98 %     Weight      Height      Head Circumference      Peak Flow      Pain Score 4     Pain Loc      Pain Edu?      Excl. in [place]?    No data found.  Updated Vital Signs BP (!) 155/84 (BP Location: Left Arm)   Pulse 64   Temp 97.9 F (36.6 C) (Oral)   Resp 16   SpO2 98%   Visual Acuity Right Eye Distance:   Left Eye Distance:   Bilateral Distance:    Right Eye Near:   Left Eye Near:    Bilateral Near:     Physical Exam Constitutional:      Appearance: Normal appearance.  HENT:     Head: Normocephalic and atraumatic.  Eyes:     Extraocular Movements: Extraocular movements intact.     Conjunctiva/sclera: Conjunctivae normal.  Pulmonary:     Effort: Pulmonary effort is normal.  Skin:    Findings: Rash present. Rash is urticarial and vesicular.     Comments: Grouped erythematous vesicular rash present to right shin.  Large bulla to center measuring approximately 2 to 3 cm in diameter that is currently draining on its own.  Neurovascular intact.  Neurological:     General: No focal deficit present.     Mental Status: She is alert and oriented to person, place, and time. Mental status is at baseline.  Psychiatric:        Mood and Affect: Mood normal.        Behavior: Behavior normal.        Thought Content: Thought content normal.        Judgment: Judgment normal.     UC Treatments / Results  Labs (all labs ordered are listed, but only abnormal results are displayed) Labs Reviewed - No data to display  EKG   Radiology No results found.  Procedures Procedures (including critical care time)  Medications Ordered in  UC Medications - No data to display  Initial Impression / Assessment and Plan / UC Course  I have reviewed the triage vital signs and the nursing notes.  Pertinent labs & imaging results that were available during my care of the patient were reviewed by me and considered in my medical decision making (see chart for details).     Differential diagnoses include cellulitis, allergic contact dermatitis, herpes zoster.  Physical exam seems most consistent with allergic contact dermatitis related to plants that has developed into blistering rash.  Low suspicion for cellulitis as there are no signs of bacterial infection on physical exam.  Low suspicion for shingles as well given rash does not follow a specific dermatome.  Unable to prescribe oral steroids given patient's history of glaucoma.  Will prescribe triamcinolone steroid cream as well as prescribing Claritin daily.  Patient advised to return if no improvement in the rash in the next 48 hours or if it worsens.  Do not think puncturing and draining bulla at this time is necessary because it will put her at risk for bacterial infection as well as it is currently draining on its own.  Discussed strict return precautions. Patient verbalized understanding and is agreeable with plan.  Final Clinical Impressions(s) / UC Diagnoses   Final diagnoses:  Contact dermatitis due to other agent, unspecified contact dermatitis type  Rash and nonspecific skin eruption     Discharge Instructions      You have been prescribed triamcinolone steroid cream and Claritin to help alleviate rash.  Please follow-up with primary care physician or urgent care if symptoms persist or worsen.     ED Prescriptions     Medication Sig Dispense Auth. Provider   triamcinolone cream (KENALOG) 0.1 % Apply 1 application topically 2 (two) times daily. 30 g Odis Luster, FNP   loratadine (CLARITIN) 10 MG tablet Take 1 tablet (10 mg total) by mouth daily. 30 tablet Odis Luster, FNP      PDMP not reviewed this encounter.   Odis Luster, FNP 05/08/21 [place], [place], [place] 05/08/21 [phone]

## 2021-05-08 NOTE — ED Triage Notes (Addendum)
Trimmed bushes last week, noticed rash to left leg Saturday, bulla developed yesterday, appears to be mildly draining today. Reports itching and pain

## 2021-05-16 DIAGNOSIS — H02834 Dermatochalasis of left upper eyelid: Secondary | ICD-10-CM | POA: Diagnosis not present

## 2021-05-16 DIAGNOSIS — H401131 Primary open-angle glaucoma, bilateral, mild stage: Secondary | ICD-10-CM | POA: Diagnosis not present

## 2021-05-16 DIAGNOSIS — H02831 Dermatochalasis of right upper eyelid: Secondary | ICD-10-CM | POA: Diagnosis not present

## 2021-05-16 DIAGNOSIS — Z961 Presence of intraocular lens: Secondary | ICD-10-CM | POA: Diagnosis not present

## 2021-05-20 DIAGNOSIS — Z23 Encounter for immunization: Secondary | ICD-10-CM | POA: Diagnosis not present

## 2021-06-06 DIAGNOSIS — N3020 Other chronic cystitis without hematuria: Secondary | ICD-10-CM | POA: Diagnosis not present

## 2021-06-06 DIAGNOSIS — R351 Nocturia: Secondary | ICD-10-CM | POA: Diagnosis not present

## 2021-06-12 DIAGNOSIS — N390 Urinary tract infection, site not specified: Secondary | ICD-10-CM | POA: Diagnosis not present

## 2021-06-12 DIAGNOSIS — S91319A Laceration without foreign body, unspecified foot, initial encounter: Secondary | ICD-10-CM | POA: Diagnosis not present

## 2021-06-12 DIAGNOSIS — S91312A Laceration without foreign body, left foot, initial encounter: Secondary | ICD-10-CM | POA: Diagnosis not present

## 2021-06-12 DIAGNOSIS — Z79899 Other long term (current) drug therapy: Secondary | ICD-10-CM | POA: Diagnosis not present

## 2021-06-12 DIAGNOSIS — R232 Flushing: Secondary | ICD-10-CM | POA: Diagnosis not present

## 2021-06-12 DIAGNOSIS — H409 Unspecified glaucoma: Secondary | ICD-10-CM | POA: Diagnosis not present

## 2021-06-12 DIAGNOSIS — Z0000 Encounter for general adult medical examination without abnormal findings: Secondary | ICD-10-CM | POA: Diagnosis not present

## 2021-06-12 DIAGNOSIS — Z23 Encounter for immunization: Secondary | ICD-10-CM | POA: Diagnosis not present

## 2021-06-12 DIAGNOSIS — F322 Major depressive disorder, single episode, severe without psychotic features: Secondary | ICD-10-CM | POA: Diagnosis not present

## 2021-06-12 DIAGNOSIS — G4700 Insomnia, unspecified: Secondary | ICD-10-CM | POA: Diagnosis not present

## 2021-06-20 ENCOUNTER — Other Ambulatory Visit: Payer: Self-pay | Admitting: Family Medicine

## 2021-06-20 ENCOUNTER — Other Ambulatory Visit: Payer: Self-pay | Admitting: Advanced Practice Midwife

## 2021-06-20 DIAGNOSIS — R5381 Other malaise: Secondary | ICD-10-CM

## 2021-06-21 ENCOUNTER — Other Ambulatory Visit: Payer: Self-pay | Admitting: Family Medicine

## 2021-06-21 DIAGNOSIS — Z13820 Encounter for screening for osteoporosis: Secondary | ICD-10-CM

## 2021-07-14 DIAGNOSIS — D1801 Hemangioma of skin and subcutaneous tissue: Secondary | ICD-10-CM | POA: Diagnosis not present

## 2021-07-14 DIAGNOSIS — L821 Other seborrheic keratosis: Secondary | ICD-10-CM | POA: Diagnosis not present

## 2021-07-14 DIAGNOSIS — M71341 Other bursal cyst, right hand: Secondary | ICD-10-CM | POA: Diagnosis not present

## 2021-07-14 DIAGNOSIS — L814 Other melanin hyperpigmentation: Secondary | ICD-10-CM | POA: Diagnosis not present

## 2021-07-25 ENCOUNTER — Other Ambulatory Visit: Payer: Self-pay | Admitting: Family Medicine

## 2021-07-25 DIAGNOSIS — Z1231 Encounter for screening mammogram for malignant neoplasm of breast: Secondary | ICD-10-CM

## 2021-08-30 ENCOUNTER — Ambulatory Visit
Admission: RE | Admit: 2021-08-30 | Discharge: 2021-08-30 | Disposition: A | Discharge status: Home / Self Care | Payer: Medicare HMO | Source: Ambulatory Visit | Attending: Family Medicine | Admitting: Family Medicine

## 2021-08-30 ENCOUNTER — Other Ambulatory Visit: Payer: Self-pay

## 2021-08-30 DIAGNOSIS — Z1231 Encounter for screening mammogram for malignant neoplasm of breast: Secondary | ICD-10-CM

## 2021-09-18 DIAGNOSIS — H401131 Primary open-angle glaucoma, bilateral, mild stage: Secondary | ICD-10-CM | POA: Diagnosis not present

## 2021-09-18 DIAGNOSIS — Z961 Presence of intraocular lens: Secondary | ICD-10-CM | POA: Diagnosis not present

## 2021-09-18 DIAGNOSIS — H02831 Dermatochalasis of right upper eyelid: Secondary | ICD-10-CM | POA: Diagnosis not present

## 2021-09-18 DIAGNOSIS — H02834 Dermatochalasis of left upper eyelid: Secondary | ICD-10-CM | POA: Diagnosis not present

## 2021-12-01 ENCOUNTER — Other Ambulatory Visit: Payer: Self-pay | Admitting: Family Medicine

## 2021-12-01 DIAGNOSIS — E2839 Other primary ovarian failure: Secondary | ICD-10-CM

## 2021-12-04 ENCOUNTER — Other Ambulatory Visit: Payer: Medicare HMO

## 2021-12-04 ENCOUNTER — Ambulatory Visit
Admission: RE | Admit: 2021-12-04 | Discharge: 2021-12-04 | Disposition: A | Discharge status: Home / Self Care | Payer: Medicare HMO | Source: Ambulatory Visit | Attending: Family Medicine | Admitting: Family Medicine

## 2021-12-04 ENCOUNTER — Other Ambulatory Visit: Payer: Self-pay | Admitting: Family Medicine

## 2021-12-04 DIAGNOSIS — Z13820 Encounter for screening for osteoporosis: Secondary | ICD-10-CM

## 2021-12-04 DIAGNOSIS — Z780 Asymptomatic menopausal state: Secondary | ICD-10-CM | POA: Diagnosis not present

## 2022-01-16 DIAGNOSIS — H401131 Primary open-angle glaucoma, bilateral, mild stage: Secondary | ICD-10-CM | POA: Diagnosis not present

## 2022-01-16 DIAGNOSIS — H02834 Dermatochalasis of left upper eyelid: Secondary | ICD-10-CM | POA: Diagnosis not present

## 2022-01-16 DIAGNOSIS — Z961 Presence of intraocular lens: Secondary | ICD-10-CM | POA: Diagnosis not present

## 2022-01-16 DIAGNOSIS — H02831 Dermatochalasis of right upper eyelid: Secondary | ICD-10-CM | POA: Diagnosis not present

## 2022-05-02 DIAGNOSIS — R509 Fever, unspecified: Secondary | ICD-10-CM | POA: Diagnosis not present

## 2022-05-02 DIAGNOSIS — R0981 Nasal congestion: Secondary | ICD-10-CM | POA: Diagnosis not present

## 2022-05-02 DIAGNOSIS — R051 Acute cough: Secondary | ICD-10-CM | POA: Diagnosis not present

## 2022-05-02 DIAGNOSIS — U071 COVID-19: Secondary | ICD-10-CM | POA: Diagnosis not present

## 2022-05-02 DIAGNOSIS — J029 Acute pharyngitis, unspecified: Secondary | ICD-10-CM | POA: Diagnosis not present

## 2022-05-21 DIAGNOSIS — Z961 Presence of intraocular lens: Secondary | ICD-10-CM | POA: Diagnosis not present

## 2022-05-21 DIAGNOSIS — H401131 Primary open-angle glaucoma, bilateral, mild stage: Secondary | ICD-10-CM | POA: Diagnosis not present

## 2022-05-21 DIAGNOSIS — H02831 Dermatochalasis of right upper eyelid: Secondary | ICD-10-CM | POA: Diagnosis not present

## 2022-05-21 DIAGNOSIS — H02834 Dermatochalasis of left upper eyelid: Secondary | ICD-10-CM | POA: Diagnosis not present

## 2022-05-26 DIAGNOSIS — Z23 Encounter for immunization: Secondary | ICD-10-CM | POA: Diagnosis not present

## 2022-06-22 DIAGNOSIS — F320 Major depressive disorder, single episode, mild: Secondary | ICD-10-CM | POA: Diagnosis not present

## 2022-06-22 DIAGNOSIS — R61 Generalized hyperhidrosis: Secondary | ICD-10-CM | POA: Diagnosis not present

## 2022-06-22 DIAGNOSIS — G4700 Insomnia, unspecified: Secondary | ICD-10-CM | POA: Diagnosis not present

## 2022-06-22 DIAGNOSIS — N390 Urinary tract infection, site not specified: Secondary | ICD-10-CM | POA: Diagnosis not present

## 2022-06-22 DIAGNOSIS — H409 Unspecified glaucoma: Secondary | ICD-10-CM | POA: Diagnosis not present

## 2022-06-22 DIAGNOSIS — Z79899 Other long term (current) drug therapy: Secondary | ICD-10-CM | POA: Diagnosis not present

## 2022-06-22 DIAGNOSIS — Z0000 Encounter for general adult medical examination without abnormal findings: Secondary | ICD-10-CM | POA: Diagnosis not present

## 2022-06-26 DIAGNOSIS — M1712 Unilateral primary osteoarthritis, left knee: Secondary | ICD-10-CM | POA: Diagnosis not present

## 2022-06-26 DIAGNOSIS — M25562 Pain in left knee: Secondary | ICD-10-CM | POA: Diagnosis not present

## 2022-06-26 DIAGNOSIS — M7051 Other bursitis of knee, right knee: Secondary | ICD-10-CM | POA: Diagnosis not present

## 2022-06-26 DIAGNOSIS — M7052 Other bursitis of knee, left knee: Secondary | ICD-10-CM | POA: Diagnosis not present

## 2022-06-26 DIAGNOSIS — M25561 Pain in right knee: Secondary | ICD-10-CM | POA: Diagnosis not present

## 2022-07-16 DIAGNOSIS — D1801 Hemangioma of skin and subcutaneous tissue: Secondary | ICD-10-CM | POA: Diagnosis not present

## 2022-07-16 DIAGNOSIS — L720 Epidermal cyst: Secondary | ICD-10-CM | POA: Diagnosis not present

## 2022-07-16 DIAGNOSIS — D485 Neoplasm of uncertain behavior of skin: Secondary | ICD-10-CM | POA: Diagnosis not present

## 2022-07-16 DIAGNOSIS — Z85828 Personal history of other malignant neoplasm of skin: Secondary | ICD-10-CM | POA: Diagnosis not present

## 2022-07-16 DIAGNOSIS — L814 Other melanin hyperpigmentation: Secondary | ICD-10-CM | POA: Diagnosis not present

## 2022-07-16 DIAGNOSIS — L821 Other seborrheic keratosis: Secondary | ICD-10-CM | POA: Diagnosis not present

## 2022-07-16 DIAGNOSIS — L570 Actinic keratosis: Secondary | ICD-10-CM | POA: Diagnosis not present

## 2022-07-16 DIAGNOSIS — Z08 Encounter for follow-up examination after completed treatment for malignant neoplasm: Secondary | ICD-10-CM | POA: Diagnosis not present

## 2022-07-16 IMAGING — MG MM DIGITAL SCREENING BILAT W/ TOMO AND CAD
8 series · 9 of 24 positions shown · non-contrast
Comparison: Previous exam(s).

CLINICAL DATA: Screening.

EXAM:
DIGITAL SCREENING BILATERAL MAMMOGRAM WITH TOMOSYNTHESIS AND CAD
TECHNIQUE: Bilateral screening digital craniocaudal and mediolateral oblique
mammograms were obtained. Bilateral screening digital breast
tomosynthesis was performed. The images were evaluated with
computer-aided detection.

[L CC synth-2D]
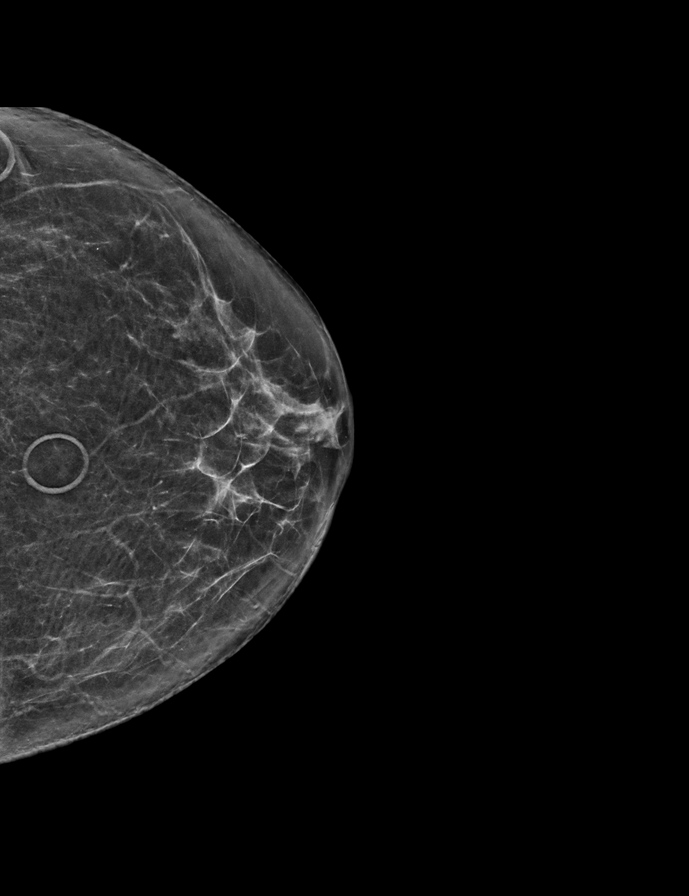

[R CC synth-2D]
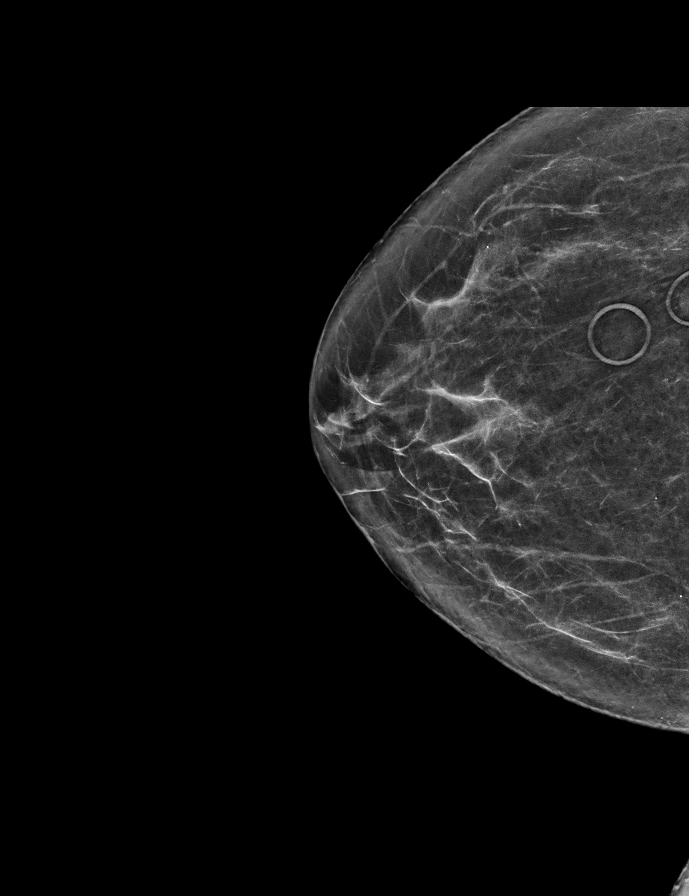

[L MLO synth-2D]
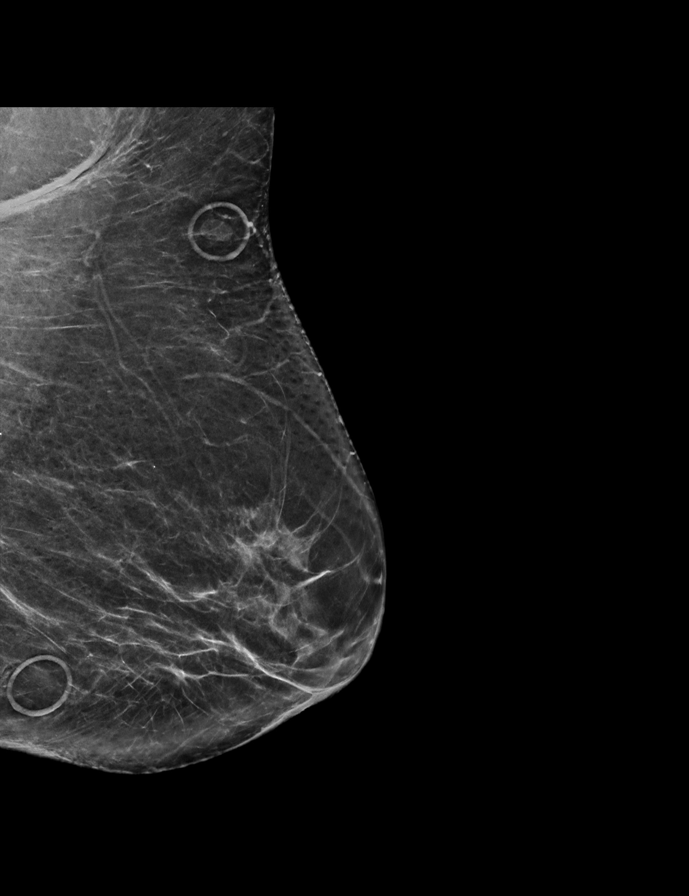

[R MLO synth-2D]
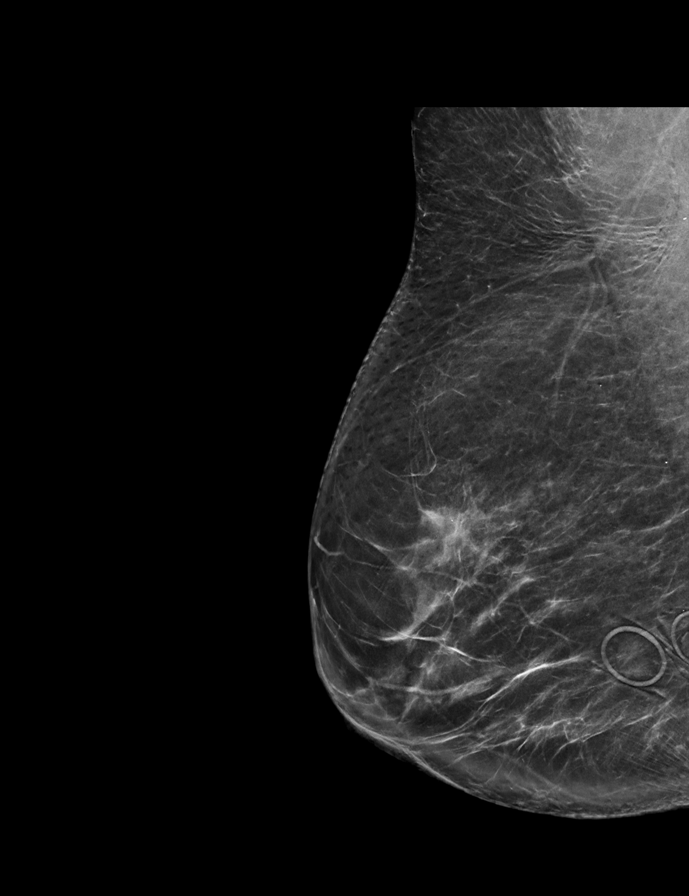

[L MLO tomo · 2 of 66 frames shown]
[frame 22/66]
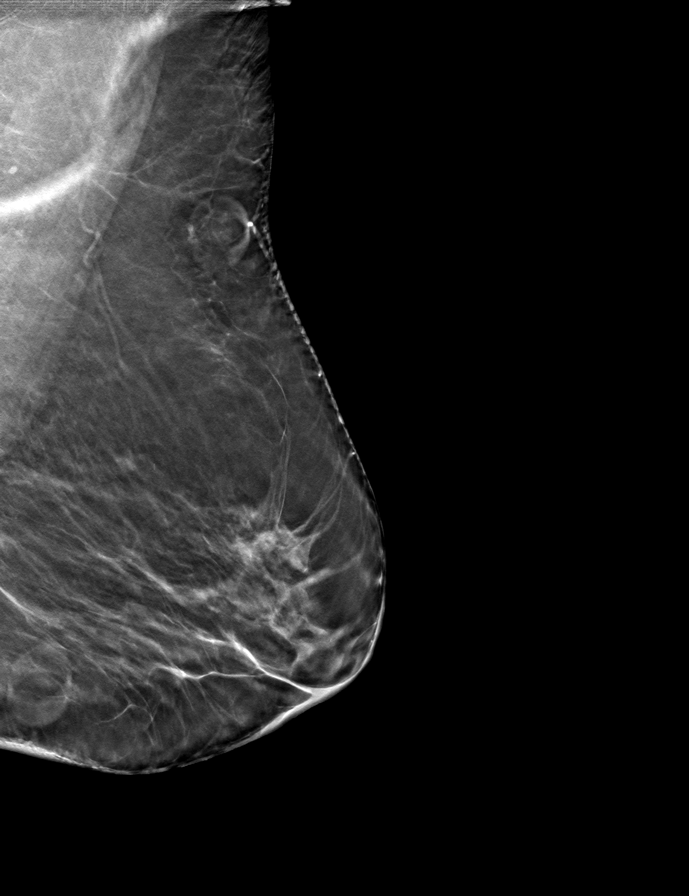
[frame 33/66]
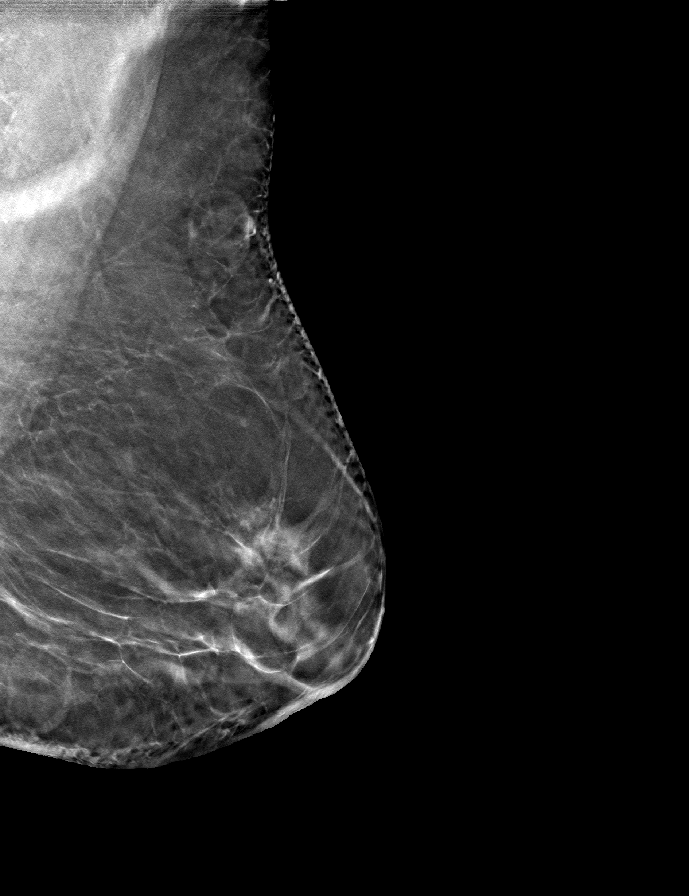

[R CC tomo · tomo slice 32/63.0]
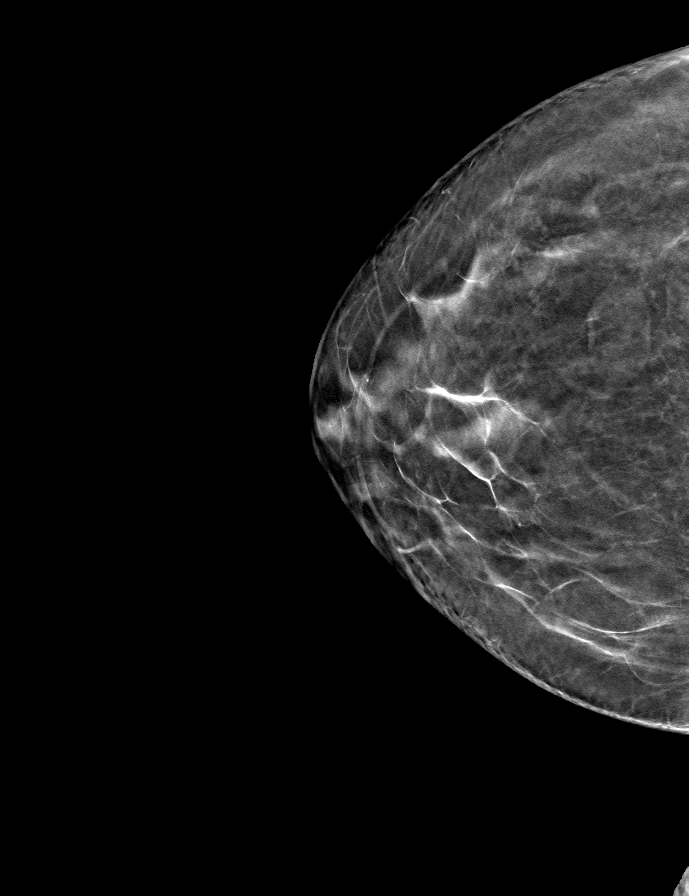

[L CC tomo · tomo slice 29/58.0]
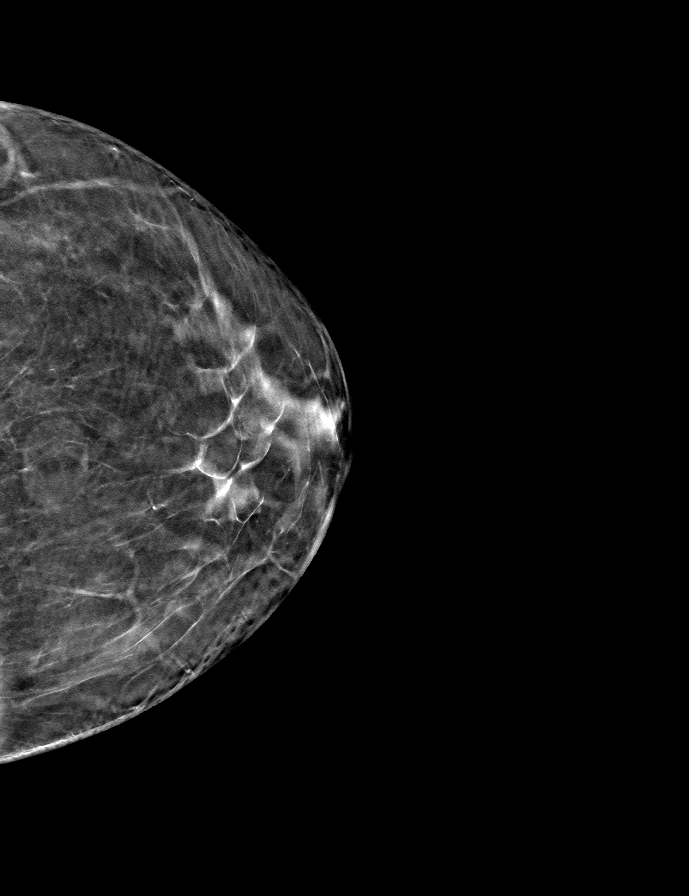

[R MLO tomo · tomo slice 35/70.0]
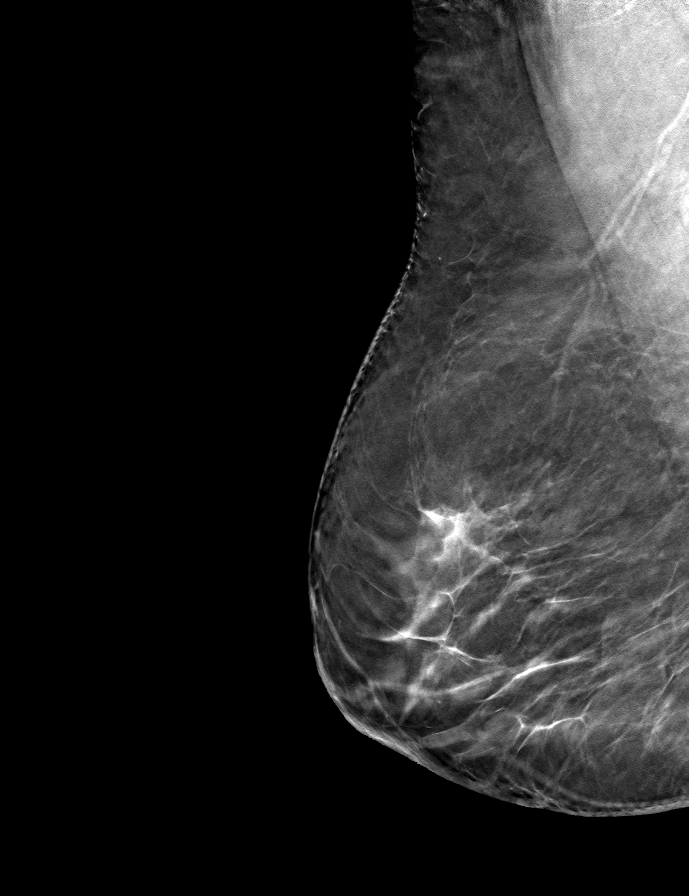

[9 of 24 positions shown; findings below may reference images not displayed]

ACR Breast Density Category b: There are scattered areas of
fibroglandular density.
FINDINGS: There are no findings suspicious for malignancy.
IMPRESSION: No mammographic evidence of malignancy. A result letter of this
screening mammogram will be mailed directly to the patient.

RECOMMENDATION:
Screening mammogram in one year. (Code:51-O-LD2)

BI-RADS CATEGORY  1: Negative.

## 2022-08-01 ENCOUNTER — Other Ambulatory Visit: Payer: Self-pay | Admitting: Family Medicine

## 2022-08-01 DIAGNOSIS — Z1231 Encounter for screening mammogram for malignant neoplasm of breast: Secondary | ICD-10-CM

## 2022-08-27 ENCOUNTER — Other Ambulatory Visit (HOSPITAL_COMMUNITY): Payer: Self-pay

## 2022-09-11 ENCOUNTER — Other Ambulatory Visit (HOSPITAL_COMMUNITY): Payer: Self-pay

## 2022-09-11 MED ORDER — TIMOLOL MALEATE 0.5 % OP SOLN
1.0000 [drp] | Freq: Every morning | OPHTHALMIC | 3 refills | Status: AC
  Filled 2022-09-11: qty 5, 100d supply, fill #0
  Filled 2023-02-05: qty 5, 100d supply, fill #1

## 2022-09-12 DIAGNOSIS — Z6826 Body mass index (BMI) 26.0-26.9, adult: Secondary | ICD-10-CM | POA: Diagnosis not present

## 2022-09-12 DIAGNOSIS — M5416 Radiculopathy, lumbar region: Secondary | ICD-10-CM | POA: Diagnosis not present

## 2022-09-21 ENCOUNTER — Ambulatory Visit
Admission: RE | Admit: 2022-09-21 | Discharge: 2022-09-21 | Disposition: A | Discharge status: Home / Self Care | Payer: HMO | Source: Ambulatory Visit | Attending: Family Medicine | Admitting: Family Medicine

## 2022-09-21 DIAGNOSIS — Z1231 Encounter for screening mammogram for malignant neoplasm of breast: Secondary | ICD-10-CM

## 2022-09-25 DIAGNOSIS — M5416 Radiculopathy, lumbar region: Secondary | ICD-10-CM | POA: Diagnosis not present

## 2022-09-25 DIAGNOSIS — M5126 Other intervertebral disc displacement, lumbar region: Secondary | ICD-10-CM | POA: Diagnosis not present

## 2022-09-27 DIAGNOSIS — M5416 Radiculopathy, lumbar region: Secondary | ICD-10-CM | POA: Diagnosis not present

## 2022-09-28 DIAGNOSIS — H401131 Primary open-angle glaucoma, bilateral, mild stage: Secondary | ICD-10-CM | POA: Diagnosis not present

## 2022-09-28 DIAGNOSIS — H02831 Dermatochalasis of right upper eyelid: Secondary | ICD-10-CM | POA: Diagnosis not present

## 2022-09-28 DIAGNOSIS — H02834 Dermatochalasis of left upper eyelid: Secondary | ICD-10-CM | POA: Diagnosis not present

## 2022-09-28 DIAGNOSIS — Z961 Presence of intraocular lens: Secondary | ICD-10-CM | POA: Diagnosis not present

## 2022-10-03 DIAGNOSIS — M5416 Radiculopathy, lumbar region: Secondary | ICD-10-CM | POA: Diagnosis not present

## 2022-10-08 DIAGNOSIS — M5416 Radiculopathy, lumbar region: Secondary | ICD-10-CM | POA: Diagnosis not present

## 2022-10-09 DIAGNOSIS — M5416 Radiculopathy, lumbar region: Secondary | ICD-10-CM | POA: Diagnosis not present

## 2022-10-10 DIAGNOSIS — M5416 Radiculopathy, lumbar region: Secondary | ICD-10-CM | POA: Diagnosis not present

## 2022-10-15 DIAGNOSIS — M5416 Radiculopathy, lumbar region: Secondary | ICD-10-CM | POA: Diagnosis not present

## 2022-10-17 DIAGNOSIS — M5416 Radiculopathy, lumbar region: Secondary | ICD-10-CM | POA: Diagnosis not present

## 2022-10-22 ENCOUNTER — Ambulatory Visit
Admission: RE | Admit: 2022-10-22 | Discharge: 2022-10-22 | Disposition: A | Discharge status: Home / Self Care | Payer: HMO | Source: Ambulatory Visit | Attending: Family Medicine | Admitting: Family Medicine

## 2022-10-22 ENCOUNTER — Other Ambulatory Visit: Payer: Self-pay | Admitting: Family Medicine

## 2022-10-22 DIAGNOSIS — M25552 Pain in left hip: Secondary | ICD-10-CM

## 2022-10-22 DIAGNOSIS — M5416 Radiculopathy, lumbar region: Secondary | ICD-10-CM | POA: Diagnosis not present

## 2022-10-22 DIAGNOSIS — M9983 Other biomechanical lesions of lumbar region: Secondary | ICD-10-CM | POA: Diagnosis not present

## 2022-10-22 DIAGNOSIS — M7062 Trochanteric bursitis, left hip: Secondary | ICD-10-CM | POA: Diagnosis not present

## 2022-10-22 DIAGNOSIS — M48061 Spinal stenosis, lumbar region without neurogenic claudication: Secondary | ICD-10-CM | POA: Diagnosis not present

## 2022-10-24 DIAGNOSIS — M5416 Radiculopathy, lumbar region: Secondary | ICD-10-CM | POA: Diagnosis not present

## 2022-10-29 DIAGNOSIS — M5416 Radiculopathy, lumbar region: Secondary | ICD-10-CM | POA: Diagnosis not present

## 2022-11-05 DIAGNOSIS — M5416 Radiculopathy, lumbar region: Secondary | ICD-10-CM | POA: Diagnosis not present

## 2022-11-07 ENCOUNTER — Other Ambulatory Visit (HOSPITAL_COMMUNITY): Payer: Self-pay

## 2022-11-07 DIAGNOSIS — M5416 Radiculopathy, lumbar region: Secondary | ICD-10-CM | POA: Diagnosis not present

## 2022-11-07 MED ORDER — DIAZEPAM 5 MG PO TABS
5.0000 mg | ORAL_TABLET | Freq: Two times a day (BID) | ORAL | 0 refills | Status: AC | PRN
  Filled 2022-11-07: qty 60, 30d supply, fill #0

## 2022-11-08 ENCOUNTER — Other Ambulatory Visit (HOSPITAL_COMMUNITY): Payer: Self-pay

## 2022-11-09 ENCOUNTER — Other Ambulatory Visit (HOSPITAL_COMMUNITY): Payer: Self-pay

## 2022-11-14 DIAGNOSIS — Z6825 Body mass index (BMI) 25.0-25.9, adult: Secondary | ICD-10-CM | POA: Diagnosis not present

## 2022-11-14 DIAGNOSIS — M5416 Radiculopathy, lumbar region: Secondary | ICD-10-CM | POA: Diagnosis not present

## 2022-11-14 DIAGNOSIS — M7138 Other bursal cyst, other site: Secondary | ICD-10-CM | POA: Diagnosis not present

## 2022-11-15 ENCOUNTER — Other Ambulatory Visit: Payer: Self-pay | Admitting: Neurosurgery

## 2022-11-20 DIAGNOSIS — N3000 Acute cystitis without hematuria: Secondary | ICD-10-CM | POA: Diagnosis not present

## 2022-11-20 DIAGNOSIS — R300 Dysuria: Secondary | ICD-10-CM | POA: Diagnosis not present

## 2022-12-07 DIAGNOSIS — G96191 Perineural cyst: Secondary | ICD-10-CM | POA: Diagnosis not present

## 2022-12-11 NOTE — Progress Notes (Signed)
Surgical Instructions    Your procedure is scheduled on Monday May 13,. 2024.  Report to [facility] Main Entrance "A" at 8:20 A.M., then check in with the Admitting office.  Call this number if you have problems the morning of surgery:  [phone]   If you have any questions prior to your surgery date call [phone]: Open Monday-Friday 8am-4pm If you experience any cold or flu symptoms such as cough, fever, chills, shortness of breath, etc. between now and your scheduled surgery, please notify us at the above number     Remember:  Do not eat or drink after midnight the night before your surgery   Take these medicines the morning of surgery with A SIP OF WATER:  diazepam (VALIUM)  timolol (TIMOPTIC)   If needed:  HYDROcodone-acetaminophen (NORCO/VICODIN)   As of today, STOP taking any Aspirin (unless otherwise instructed by your surgeon) Aleve, Naproxen, Ibuprofen, Motrin, Advil, Goody's, BC's, all herbal medications, fish oil, and all vitamins.  Special instructions:    Oral Hygiene is also important to reduce your risk of infection.  Remember - BRUSH YOUR TEETH THE MORNING OF SURGERY WITH YOUR REGULAR TOOTHPASTE   [place]- Preparing For Surgery  Before surgery, you can play an important role. Because skin is not sterile, your skin needs to be as free of germs as possible. You can reduce the number of germs on your skin by washing with CHG (chlorahexidine gluconate) Soap before surgery.  CHG is an antiseptic cleaner which kills germs and bonds with the skin to continue killing germs even after washing.     Please do not use if you have an allergy to CHG or antibacterial soaps. If your skin becomes reddened/irritated stop using the CHG.  Do not shave (including legs and underarms) for at least 48 hours prior to first CHG shower. It is OK to shave your face.  Day of Surgery:  Take a shower with CHG soap. Wear Clean/Comfortable clothing the morning of surgery Do not  apply any deodorants/lotions.   Remember to brush your teeth WITH YOUR REGULAR TOOTHPASTE.  Do not wear jewelry or makeup. Do not wear lotions, powders, perfumes/cologne or deodorant. Do not shave 48 hours prior to surgery.  Men may shave face and neck. Do not bring valuables to the hospital. Do not wear nail polish, gel polish, artificial nails, or any other type of covering on natural nails (fingers and toes) If you have artificial nails or gel coating that need to be removed by a nail salon, please have this removed prior to surgery. Artificial nails or gel coating may interfere with anesthesia's ability to adequately monitor your vital signs.  [facility] is not responsible for any belongings or valuables.    Do NOT Smoke (Tobacco/Vaping)  24 hours prior to your procedure  If you use a CPAP at night, you may bring your mask for your overnight stay.   Contacts, glasses, hearing aids, dentures or partials may not be worn into surgery, please bring cases for these belongings   For patients admitted to the hospital, discharge time will be determined by your treatment team.   Patients discharged the day of surgery will not be allowed to drive home, and someone needs to stay with them for 24 hours.   SURGICAL WAITING ROOM VISITATION Patients having surgery or a procedure may have no more than 2 support people in the waiting area - these visitors may rotate.   Children under the age of 50 must have  an adult with them who is not the patient. If the patient needs to stay at the hospital during part of their recovery, the visitor guidelines for inpatient rooms apply. Pre-op nurse will coordinate an appropriate time for 1 support person to accompany patient in pre-op.  This support person may not rotate.   Please refer to [email or web address] for the visitor guidelines for Inpatients (after your surgery is over and you are in a regular room).    If you received a COVID test during your pre-op visit, it is requested that you wear a mask when out in public, stay away from anyone that may not be feeling well, and notify your surgeon if you develop symptoms. If you have been in contact with anyone that has tested positive in the last 10 days, please notify your surgeon.    Please read over the following fact sheets that you were given.

## 2022-12-12 ENCOUNTER — Encounter (HOSPITAL_COMMUNITY)
Admission: RE | Admit: 2022-12-12 | Discharge: 2022-12-12 | Disposition: A | Discharge status: Home / Self Care | Payer: HMO | Source: Ambulatory Visit | Attending: Neurosurgery | Admitting: Neurosurgery

## 2022-12-12 ENCOUNTER — Other Ambulatory Visit: Payer: Self-pay

## 2022-12-12 ENCOUNTER — Encounter (HOSPITAL_COMMUNITY): Payer: Self-pay

## 2022-12-12 VITALS — BP 146/74 | HR 62 | Temp 98.5°F | Resp 18 | Ht 66.0 in | Wt 158.7 lb

## 2022-12-12 DIAGNOSIS — Z01818 Encounter for other preprocedural examination: Secondary | ICD-10-CM

## 2022-12-12 DIAGNOSIS — Z01812 Encounter for preprocedural laboratory examination: Secondary | ICD-10-CM | POA: Insufficient documentation

## 2022-12-12 LAB — TYPE AND SCREEN
ABO/RH(D): A POS
Antibody Screen: NEGATIVE

## 2022-12-12 LAB — CBC
HCT: 41.4 % (ref 36.0–46.0)
Hemoglobin: 14.1 g/dL (ref 12.0–15.0)
MCH: 32.9 pg (ref 26.0–34.0)
MCHC: 34.1 g/dL (ref 30.0–36.0)
MCV: 96.5 fL (ref 80.0–100.0)
Platelets: 212 10*3/uL (ref 150–400)
RBC: 4.29 MIL/uL (ref 3.87–5.11)
RDW: 13 % (ref 11.5–15.5)
WBC: 5.4 10*3/uL (ref 4.0–10.5)
nRBC: 0 % (ref 0.0–0.2)

## 2022-12-12 LAB — SURGICAL PCR SCREEN
MRSA, PCR: NEGATIVE
Staphylococcus aureus: NEGATIVE

## 2022-12-12 NOTE — Progress Notes (Signed)
PCP - Lupita Raider Cardiologist - denies  PPM/ICD - denies Chest x-ray - n/a EKG - n/a Stress Test - denies ECHO - denies Cardiac Cath - denies  Sleep Study - denies  ERAS Protcol -no  COVID TEST- not needed   Anesthesia review: no  Patient denies shortness of breath, fever, cough and chest pain at PAT appointment   All instructions explained to the patient, with a verbal understanding of the material. Patient agrees to go over the instructions while at home for a better understanding. Patient also instructed to self quarantine after being tested for COVID-19. The opportunity to ask questions was provided.

## 2022-12-14 NOTE — Progress Notes (Signed)
Patient was called and informed that the surgery time for tomorrow was changed to 09:30 o'clock. Patient was instructed to be at the hospital at 07:30 o'clock; nothing to eat or drink after midnight. Patient verbalized understanding.

## 2022-12-16 NOTE — Anesthesia Preprocedure Evaluation (Signed)
Anesthesia Evaluation  Patient identified by MRN, date of birth, ID band Patient awake    Reviewed: Allergy & Precautions, NPO status , Patient's Chart, lab work & pertinent test results  Airway Mallampati: II  TM Distance: >3 FB Neck ROM: Full    Dental no notable dental hx.    Pulmonary former smoker   Pulmonary exam normal        Cardiovascular hypertension, Pt. on medications  Rhythm:Regular Rate:Normal     Neuro/Psych  Headaches  negative psych ROS   GI/Hepatic Neg liver ROS, hiatal hernia,,,  Endo/Other  negative endocrine ROS    Renal/GU negative Renal ROS  negative genitourinary   Musculoskeletal Synovial cyst   Abdominal Normal abdominal exam  (+)   Peds  Hematology Lab Results      Component                Value               Date                      WBC                      5.4                 12/12/2022                HGB                      14.1                12/12/2022                HCT                      41.4                12/12/2022                MCV                      96.5                12/12/2022                PLT                      212                 12/12/2022             Lab Results      Component                Value               Date                      NA                       141                 12/12/2016                K  3.9                 12/12/2016                CO2                      26                  12/12/2016                GLUCOSE                  95                  12/12/2016                BUN                      17                  12/12/2016                CREATININE               0.87                12/12/2016                CALCIUM                  9.4                 12/12/2016                GFRNONAA                 >60                 12/12/2016              Anesthesia Other Findings   Reproductive/Obstetrics                              Anesthesia Physical Anesthesia Plan  ASA: 2  Anesthesia Plan: General   Post-op Pain Management: Celebrex PO (pre-op)* and Tylenol PO (pre-op)*   Induction: Intravenous  PONV Risk Score and Plan: 3 and Ondansetron, Dexamethasone and Treatment may vary due to age or medical condition  Airway Management Planned: Mask and Oral ETT  Additional Equipment:   Intra-op Plan:   Post-operative Plan: Extubation in OR  Informed Consent: I have reviewed the patients History and Physical, chart, labs and discussed the procedure including the risks, benefits and alternatives for the proposed anesthesia with the patient or authorized representative who has indicated his/her understanding and acceptance.     Dental advisory given  Plan Discussed with: CRNA  Anesthesia Plan Comments:        Anesthesia Quick Evaluation

## 2022-12-17 ENCOUNTER — Other Ambulatory Visit: Payer: Self-pay

## 2022-12-17 ENCOUNTER — Encounter (HOSPITAL_COMMUNITY)
Admission: RE | Disposition: A | Discharge status: Home Health | Payer: Self-pay | Source: Home / Self Care | Attending: Neurosurgery

## 2022-12-17 ENCOUNTER — Observation Stay (HOSPITAL_COMMUNITY)
Admission: RE | Admit: 2022-12-17 | Discharge: 2022-12-18 | Disposition: A | Discharge status: Home Health | Payer: HMO | Attending: Neurosurgery | Admitting: Neurosurgery

## 2022-12-17 ENCOUNTER — Ambulatory Visit (HOSPITAL_BASED_OUTPATIENT_CLINIC_OR_DEPARTMENT_OTHER): Payer: HMO | Admitting: Certified Registered"

## 2022-12-17 ENCOUNTER — Ambulatory Visit (HOSPITAL_COMMUNITY): Payer: HMO | Admitting: Certified Registered"

## 2022-12-17 ENCOUNTER — Ambulatory Visit (HOSPITAL_COMMUNITY): Payer: HMO

## 2022-12-17 ENCOUNTER — Encounter (HOSPITAL_COMMUNITY): Payer: Self-pay | Admitting: Neurosurgery

## 2022-12-17 DIAGNOSIS — M7138 Other bursal cyst, other site: Secondary | ICD-10-CM | POA: Diagnosis not present

## 2022-12-17 DIAGNOSIS — Z79899 Other long term (current) drug therapy: Secondary | ICD-10-CM | POA: Diagnosis not present

## 2022-12-17 DIAGNOSIS — I10 Essential (primary) hypertension: Secondary | ICD-10-CM | POA: Diagnosis not present

## 2022-12-17 DIAGNOSIS — Z981 Arthrodesis status: Secondary | ICD-10-CM | POA: Diagnosis not present

## 2022-12-17 DIAGNOSIS — M4726 Other spondylosis with radiculopathy, lumbar region: Secondary | ICD-10-CM | POA: Diagnosis not present

## 2022-12-17 DIAGNOSIS — Z87891 Personal history of nicotine dependence: Secondary | ICD-10-CM | POA: Insufficient documentation

## 2022-12-17 DIAGNOSIS — M5116 Intervertebral disc disorders with radiculopathy, lumbar region: Principal | ICD-10-CM | POA: Insufficient documentation

## 2022-12-17 DIAGNOSIS — M48061 Spinal stenosis, lumbar region without neurogenic claudication: Secondary | ICD-10-CM | POA: Diagnosis not present

## 2022-12-17 DIAGNOSIS — Z7982 Long term (current) use of aspirin: Secondary | ICD-10-CM | POA: Diagnosis not present

## 2022-12-17 DIAGNOSIS — M5416 Radiculopathy, lumbar region: Secondary | ICD-10-CM | POA: Diagnosis not present

## 2022-12-17 DIAGNOSIS — Z4789 Encounter for other orthopedic aftercare: Secondary | ICD-10-CM | POA: Diagnosis not present

## 2022-12-17 LAB — ABO/RH: ABO/RH(D): A POS

## 2022-12-17 SURGERY — POSTERIOR LUMBAR FUSION 1 LEVEL
Anesthesia: General | Site: Back

## 2022-12-17 MED ORDER — BISACODYL 10 MG RE SUPP: 10.0000 mg | Freq: Every day | RECTAL | Status: DC | PRN

## 2022-12-17 MED ORDER — CHLORHEXIDINE GLUCONATE 0.12 % MT SOLN
15.0000 mL | Freq: Once | OROMUCOSAL | Status: AC
  Administered 2022-12-17: 15 mL via OROMUCOSAL
  Filled 2022-12-17: qty 15

## 2022-12-17 MED ORDER — HYDROCODONE-ACETAMINOPHEN 10-325 MG PO TABS: 1.0000 | ORAL_TABLET | ORAL | Status: DC | PRN

## 2022-12-17 MED ORDER — SODIUM CHLORIDE 0.9 % IV SOLN
250.0000 mL | INTRAVENOUS | Status: DC
  Administered 2022-12-17: 250 mL via INTRAVENOUS

## 2022-12-17 MED ORDER — SACCHAROMYCES BOULARDII 250 MG PO CAPS
250.0000 mg | ORAL_CAPSULE | Freq: Every day | ORAL | Status: DC
  Administered 2022-12-17 – 2022-12-18 (×2): 250 mg via ORAL
  Filled 2022-12-17 (×2): qty 1

## 2022-12-17 MED ORDER — DIAZEPAM 5 MG PO TABS
5.0000 mg | ORAL_TABLET | Freq: Four times a day (QID) | ORAL | Status: DC | PRN
  Administered 2022-12-17 (×2): 5 mg via ORAL
  Filled 2022-12-17 (×2): qty 1

## 2022-12-17 MED ORDER — DEXAMETHASONE SODIUM PHOSPHATE 10 MG/ML IJ SOLN
INTRAMUSCULAR | Status: DC | PRN
  Administered 2022-12-17: 5 mg via INTRAVENOUS

## 2022-12-17 MED ORDER — THROMBIN 20000 UNITS EX SOLR
CUTANEOUS | Status: AC
  Filled 2022-12-17: qty 20000

## 2022-12-17 MED ORDER — LACTATED RINGERS IV SOLN: INTRAVENOUS | Status: DC

## 2022-12-17 MED ORDER — LIDOCAINE 2% (20 MG/ML) 5 ML SYRINGE
INTRAMUSCULAR | Status: DC | PRN
  Administered 2022-12-17: 60 mg via INTRAVENOUS

## 2022-12-17 MED ORDER — CEFAZOLIN SODIUM-DEXTROSE 2-4 GM/100ML-% IV SOLN
2.0000 g | INTRAVENOUS | Status: AC
  Administered 2022-12-17: 2 g via INTRAVENOUS
  Filled 2022-12-17: qty 100

## 2022-12-17 MED ORDER — ACETAMINOPHEN 325 MG PO TABS
650.0000 mg | ORAL_TABLET | ORAL | Status: DC | PRN
  Administered 2022-12-17 – 2022-12-18 (×4): 650 mg via ORAL
  Filled 2022-12-17 (×4): qty 2

## 2022-12-17 MED ORDER — ONDANSETRON HCL 4 MG/2ML IJ SOLN
INTRAMUSCULAR | Status: AC
  Filled 2022-12-17: qty 2

## 2022-12-17 MED ORDER — FENTANYL CITRATE (PF) 100 MCG/2ML IJ SOLN
INTRAMUSCULAR | Status: DC | PRN
  Administered 2022-12-17 (×2): 50 ug via INTRAVENOUS
  Administered 2022-12-17 (×3): 25 ug via INTRAVENOUS
  Administered 2022-12-17: 50 ug via INTRAVENOUS
  Administered 2022-12-17: 25 ug via INTRAVENOUS

## 2022-12-17 MED ORDER — TIMOLOL MALEATE 0.5 % OP SOLN
1.0000 [drp] | Freq: Every morning | OPHTHALMIC | Status: DC
  Administered 2022-12-18: 1 [drp] via OPHTHALMIC
  Filled 2022-12-17: qty 5

## 2022-12-17 MED ORDER — FENTANYL CITRATE (PF) 100 MCG/2ML IJ SOLN
INTRAMUSCULAR | Status: AC
  Filled 2022-12-17: qty 2

## 2022-12-17 MED ORDER — FENTANYL CITRATE (PF) 100 MCG/2ML IJ SOLN: INTRAMUSCULAR | Status: DC | PRN

## 2022-12-17 MED ORDER — VANCOMYCIN HCL 1000 MG IV SOLR
INTRAVENOUS | Status: AC
  Filled 2022-12-17: qty 20

## 2022-12-17 MED ORDER — FENTANYL CITRATE (PF) 100 MCG/2ML IJ SOLN
25.0000 ug | INTRAMUSCULAR | Status: DC | PRN
  Administered 2022-12-17 (×3): 50 ug via INTRAVENOUS

## 2022-12-17 MED ORDER — PROPOFOL 10 MG/ML IV BOLUS
INTRAVENOUS | Status: DC | PRN
  Administered 2022-12-17: 130 mg via INTRAVENOUS

## 2022-12-17 MED ORDER — 0.9 % SODIUM CHLORIDE (POUR BTL) OPTIME
TOPICAL | Status: DC | PRN
  Administered 2022-12-17: 1000 mL

## 2022-12-17 MED ORDER — BUPIVACAINE HCL (PF) 0.25 % IJ SOLN
INTRAMUSCULAR | Status: DC | PRN
  Administered 2022-12-17: 20 mL

## 2022-12-17 MED ORDER — SODIUM CHLORIDE 0.9% FLUSH
3.0000 mL | Freq: Two times a day (BID) | INTRAVENOUS | Status: DC
  Administered 2022-12-17 – 2022-12-18 (×2): 3 mL via INTRAVENOUS

## 2022-12-17 MED ORDER — KETOROLAC TROMETHAMINE 0.5 % OP SOLN: 1.0000 [drp] | Freq: Four times a day (QID) | OPHTHALMIC | Status: DC | PRN

## 2022-12-17 MED ORDER — BUPIVACAINE HCL (PF) 0.25 % IJ SOLN
INTRAMUSCULAR | Status: AC
  Filled 2022-12-17: qty 30

## 2022-12-17 MED ORDER — PHENYLEPHRINE HCL-NACL 20-0.9 MG/250ML-% IV SOLN
INTRAVENOUS | Status: DC | PRN
  Administered 2022-12-17: 20 ug/min via INTRAVENOUS

## 2022-12-17 MED ORDER — THROMBIN 20000 UNITS EX SOLR
CUTANEOUS | Status: DC | PRN
  Administered 2022-12-17: 20 mL via TOPICAL

## 2022-12-17 MED ORDER — ORAL CARE MOUTH RINSE: 15.0000 mL | Freq: Once | OROMUCOSAL | Status: AC

## 2022-12-17 MED ORDER — CEFAZOLIN SODIUM-DEXTROSE 1-4 GM/50ML-% IV SOLN
1.0000 g | Freq: Three times a day (TID) | INTRAVENOUS | Status: AC
  Administered 2022-12-17 – 2022-12-18 (×2): 1 g via INTRAVENOUS
  Filled 2022-12-17 (×2): qty 50

## 2022-12-17 MED ORDER — FENTANYL CITRATE (PF) 250 MCG/5ML IJ SOLN
INTRAMUSCULAR | Status: AC
  Filled 2022-12-17: qty 5

## 2022-12-17 MED ORDER — GABAPENTIN 300 MG PO CAPS
300.0000 mg | ORAL_CAPSULE | Freq: Every day | ORAL | Status: DC
  Administered 2022-12-17: 300 mg via ORAL
  Filled 2022-12-17: qty 1

## 2022-12-17 MED ORDER — ONDANSETRON HCL 4 MG/2ML IJ SOLN: 4.0000 mg | Freq: Four times a day (QID) | INTRAMUSCULAR | Status: DC | PRN

## 2022-12-17 MED ORDER — SUGAMMADEX SODIUM 200 MG/2ML IV SOLN
INTRAVENOUS | Status: DC | PRN
  Administered 2022-12-17 (×3): 50 mg via INTRAVENOUS

## 2022-12-17 MED ORDER — ONDANSETRON HCL 4 MG PO TABS: 4.0000 mg | ORAL_TABLET | Freq: Four times a day (QID) | ORAL | Status: DC | PRN

## 2022-12-17 MED ORDER — FLEET ENEMA 7-19 GM/118ML RE ENEM: 1.0000 | ENEMA | Freq: Once | RECTAL | Status: DC | PRN

## 2022-12-17 MED ORDER — POLYETHYLENE GLYCOL 3350 17 G PO PACK: 17.0000 g | PACK | Freq: Every day | ORAL | Status: DC | PRN

## 2022-12-17 MED ORDER — VANCOMYCIN HCL 1000 MG IV SOLR
INTRAVENOUS | Status: DC | PRN
  Administered 2022-12-17: 1000 mg via TOPICAL

## 2022-12-17 MED ORDER — ALBUMIN HUMAN 5 % IV SOLN: INTRAVENOUS | Status: DC | PRN

## 2022-12-17 MED ORDER — ASPIRIN 81 MG PO TBEC
81.0000 mg | DELAYED_RELEASE_TABLET | Freq: Every day | ORAL | Status: DC
  Administered 2022-12-18: 81 mg via ORAL
  Filled 2022-12-17: qty 1

## 2022-12-17 MED ORDER — PROPOFOL 10 MG/ML IV BOLUS
INTRAVENOUS | Status: AC
  Filled 2022-12-17: qty 20

## 2022-12-17 MED ORDER — ACETAMINOPHEN 500 MG PO TABS
1000.0000 mg | ORAL_TABLET | Freq: Once | ORAL | Status: AC
  Administered 2022-12-17: 1000 mg via ORAL
  Filled 2022-12-17: qty 2

## 2022-12-17 MED ORDER — DEXAMETHASONE SODIUM PHOSPHATE 10 MG/ML IJ SOLN
INTRAMUSCULAR | Status: AC
  Filled 2022-12-17: qty 1

## 2022-12-17 MED ORDER — ROCURONIUM BROMIDE 100 MG/10ML IV SOLN
INTRAVENOUS | Status: DC | PRN
  Administered 2022-12-17: 10 mg via INTRAVENOUS
  Administered 2022-12-17: 20 mg via INTRAVENOUS
  Administered 2022-12-17: 50 mg via INTRAVENOUS
  Administered 2022-12-17: 10 mg via INTRAVENOUS

## 2022-12-17 MED ORDER — LORATADINE 10 MG PO TABS
10.0000 mg | ORAL_TABLET | Freq: Every day | ORAL | Status: DC
  Administered 2022-12-17 – 2022-12-18 (×2): 10 mg via ORAL
  Filled 2022-12-17 (×2): qty 1

## 2022-12-17 MED ORDER — OXYCODONE HCL 5 MG PO TABS
ORAL_TABLET | ORAL | Status: AC
  Filled 2022-12-17: qty 2

## 2022-12-17 MED ORDER — HYDROMORPHONE HCL 1 MG/ML IJ SOLN
1.0000 mg | INTRAMUSCULAR | Status: DC | PRN
  Administered 2022-12-17: 1 mg via INTRAVENOUS
  Filled 2022-12-17: qty 1

## 2022-12-17 MED ORDER — SODIUM CHLORIDE 0.9% FLUSH: 3.0000 mL | INTRAVENOUS | Status: DC | PRN

## 2022-12-17 MED ORDER — ROCURONIUM BROMIDE 10 MG/ML (PF) SYRINGE
PREFILLED_SYRINGE | INTRAVENOUS | Status: AC
  Filled 2022-12-17: qty 10

## 2022-12-17 MED ORDER — OXYCODONE HCL 5 MG PO TABS
10.0000 mg | ORAL_TABLET | ORAL | Status: DC | PRN
  Administered 2022-12-17 – 2022-12-18 (×4): 10 mg via ORAL
  Filled 2022-12-17 (×3): qty 2

## 2022-12-17 MED ORDER — PROPOFOL 1000 MG/100ML IV EMUL
INTRAVENOUS | Status: AC
  Filled 2022-12-17: qty 100

## 2022-12-17 MED ORDER — CHLORHEXIDINE GLUCONATE CLOTH 2 % EX PADS: 6.0000 | MEDICATED_PAD | Freq: Once | CUTANEOUS | Status: DC

## 2022-12-17 MED ORDER — MENTHOL 3 MG MT LOZG: 1.0000 | LOZENGE | OROMUCOSAL | Status: DC | PRN

## 2022-12-17 MED ORDER — PHENOL 1.4 % MT LIQD: 1.0000 | OROMUCOSAL | Status: DC | PRN

## 2022-12-17 MED ORDER — ONDANSETRON HCL 4 MG/2ML IJ SOLN
INTRAMUSCULAR | Status: DC | PRN
  Administered 2022-12-17: 4 mg via INTRAVENOUS

## 2022-12-17 MED ORDER — ACETAMINOPHEN 650 MG RE SUPP: 650.0000 mg | RECTAL | Status: DC | PRN

## 2022-12-17 MED ORDER — CELECOXIB 200 MG PO CAPS
200.0000 mg | ORAL_CAPSULE | Freq: Once | ORAL | Status: AC
  Administered 2022-12-17: 200 mg via ORAL
  Filled 2022-12-17: qty 1

## 2022-12-17 SURGICAL SUPPLY — 61 items
ADH SKN CLS APL DERMABOND .7 (GAUZE/BANDAGES/DRESSINGS) ×1
APL SKNCLS STERI-STRIP NONHPOA (GAUZE/BANDAGES/DRESSINGS) ×1
BAG COUNTER SPONGE SURGICOUNT (BAG) ×2 IMPLANT
BAG DECANTER FOR FLEXI CONT (MISCELLANEOUS) ×2 IMPLANT
BAG SPNG CNTER NS LX DISP (BAG) ×1
BENZOIN TINCTURE PRP APPL 2/3 (GAUZE/BANDAGES/DRESSINGS) ×2 IMPLANT
BLADE BONE MILL MEDIUM (MISCELLANEOUS) ×2 IMPLANT
BLADE CLIPPER SURG (BLADE) IMPLANT
BUR CUTTER 7.0 ROUND (BURR) ×2 IMPLANT
BUR MATCHSTICK NEURO 3.0 LAGG (BURR) ×2 IMPLANT
CANISTER SUCT 3000ML PPV (MISCELLANEOUS) ×2 IMPLANT
CNTNR URN SCR LID CUP LEK RST (MISCELLANEOUS) ×2 IMPLANT
CONT SPEC 4OZ STRL OR WHT (MISCELLANEOUS) ×1
COVER BACK TABLE 60X90IN (DRAPES) ×2 IMPLANT
DERMABOND ADVANCED .7 DNX12 (GAUZE/BANDAGES/DRESSINGS) ×2 IMPLANT
DRAPE C-ARM 42X72 X-RAY (DRAPES) ×4 IMPLANT
DRAPE HALF SHEET 40X57 (DRAPES) IMPLANT
DRAPE LAPAROTOMY 100X72X124 (DRAPES) ×2 IMPLANT
DRAPE SURG 17X23 STRL (DRAPES) ×8 IMPLANT
DRSG OPSITE POSTOP 4X6 (GAUZE/BANDAGES/DRESSINGS) ×2 IMPLANT
DURAPREP 26ML APPLICATOR (WOUND CARE) ×2 IMPLANT
ELECT REM PT RETURN 9FT ADLT (ELECTROSURGICAL) ×1
ELECTRODE REM PT RTRN 9FT ADLT (ELECTROSURGICAL) ×2 IMPLANT
EVACUATOR 1/8 PVC DRAIN (DRAIN) IMPLANT
GAUZE 4X4 16PLY ~~LOC~~+RFID DBL (SPONGE) IMPLANT
GAUZE SPONGE 4X4 12PLY STRL (GAUZE/BANDAGES/DRESSINGS) IMPLANT
GLOVE BIO SURGEON STRL SZ 6.5 (GLOVE) ×2 IMPLANT
GLOVE BIOGEL PI IND STRL 6.5 (GLOVE) ×2 IMPLANT
GLOVE ECLIPSE 9.0 STRL (GLOVE) ×4 IMPLANT
GLOVE EXAM NITRILE XL STR (GLOVE) IMPLANT
GOWN STRL REUS W/ TWL LRG LVL3 (GOWN DISPOSABLE) IMPLANT
GOWN STRL REUS W/ TWL XL LVL3 (GOWN DISPOSABLE) ×4 IMPLANT
GOWN STRL REUS W/TWL 2XL LVL3 (GOWN DISPOSABLE) IMPLANT
GOWN STRL REUS W/TWL LRG LVL3 (GOWN DISPOSABLE)
GOWN STRL REUS W/TWL XL LVL3 (GOWN DISPOSABLE) ×2
KIT BASIN OR (CUSTOM PROCEDURE TRAY) ×2 IMPLANT
KIT TURNOVER KIT B (KITS) ×2 IMPLANT
NEEDLE HYPO 22GX1.5 SAFETY (NEEDLE) ×2 IMPLANT
NS IRRIG 1000ML POUR BTL (IV SOLUTION) ×2 IMPLANT
PACK LAMINECTOMY NEURO (CUSTOM PROCEDURE TRAY) ×2 IMPLANT
PUTTY DBF 6CC CORTICAL FIBERS (Putty) IMPLANT
ROD RADIUS 40MM (Neuro Prosthesis/Implant) ×2 IMPLANT
ROD SPNL 40X5.5XNS TI RDS (Neuro Prosthesis/Implant) IMPLANT
SCREW POLYAXIAL 6.5X40 (Screw) IMPLANT
SCREW POLYAXIAL EVEREST 7.5X40 (Screw) IMPLANT
SET SCREW (Screw) ×4 IMPLANT
SET SCREW VRST (Screw) IMPLANT
SOL ELECTROSURG ANTI STICK (MISCELLANEOUS) ×1
SOLUTION ELECTROSURG ANTI STCK (MISCELLANEOUS) ×2 IMPLANT
SPACER PL CATALYFT LONG 11 (Spacer) IMPLANT
SPIKE FLUID TRANSFER (MISCELLANEOUS) ×2 IMPLANT
SPONGE SURGIFOAM ABS GEL 100 (HEMOSTASIS) ×2 IMPLANT
STRIP CLOSURE SKIN 1/2X4 (GAUZE/BANDAGES/DRESSINGS) ×4 IMPLANT
SUT VIC AB 0 CT1 18XCR BRD8 (SUTURE) ×4 IMPLANT
SUT VIC AB 0 CT1 8-18 (SUTURE) ×1
SUT VIC AB 2-0 CT1 18 (SUTURE) ×2 IMPLANT
SUT VIC AB 3-0 SH 8-18 (SUTURE) ×4 IMPLANT
TOWEL GREEN STERILE (TOWEL DISPOSABLE) ×2 IMPLANT
TOWEL GREEN STERILE FF (TOWEL DISPOSABLE) ×2 IMPLANT
TRAY FOLEY MTR SLVR 16FR STAT (SET/KITS/TRAYS/PACK) ×2 IMPLANT
WATER STERILE IRR 1000ML POUR (IV SOLUTION) ×2 IMPLANT

## 2022-12-17 NOTE — Op Note (Signed)
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  Left L3-4 laminotomy and resection of adherent synovial cyst, microdissection  L3-4 posterior lumbar interbody fusion utilizing interbody cages, locally harvested autograft, and morselized allograft  L3-4 posterior lateral arthrodesis utilizing nonsegmental pedicle screw fixation and local autograft  Reexploration of L4-5 fusion with removal of hardware  Surgeon:Leasia Swann A.Danitza Schoenfeldt, M.D.  Asst. Surgeon: Doran Durand, NP  Assistant utilized for exposure, decompression, fusion, instrumentation and closure.  Anesthesia: General  Indication: 82 year old female with a remote history of L4-5 decompression and fusion presents with intractable left lower extremity radicular pain failing conservative management.  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  Superimposed upon this on the left side is a dorsal left-sided L3-4 facet synovial cyst causing marked compression of the left L4 nerve root and some compression of her left L3 nerve root.  Patient presents now for decompression including resection of synovial cyst with fusion at the L3-4 level.  This will require reexploration of her prior L4-5 fusion and removal of her old instrumentation which is no longer able to be utilized.  Operative note: After induction of anesthesia, patient positioned prone on the Wilson frame and properly padded.  Lumbar region prepped and draped sterilely.  Incision made overlying L3-4-5.  Dissection performed bilaterally.  Retractor placed.  Fluoroscopy used.   Levels confirmed.  Previously placed pedicle screws potation at L4-5 was dissected free.  This was 29 D instrumentation from [employer].  This is now obsolete.  The hardware was disassembled and removed.  Fusion at L4-5 explored and found to be quite solid at L4-5.  Attention then placed to the L3-4 level.  Decompressive laminotomies and inferior laminectomy was then performed using Leksell rongeurs, [place] under the high-speed drill to remove the inferior two thirds of the lamina of L3.  The entire inferior facet of L3 was removed bilaterally.  The majority of the superior facet of L4 was removed bilaterally.  Small amount of rudimentary residual lamina of L4 was also resected.  Foraminotomies complete on the course exiting L3 and L4 nerve roots.  Ligament flavum elevated and resected.  On the left side there was a moderately large synovial cyst which was adherent to the dura.  Utilizing microdissection the cyst was carefully dissected free from the underlying thecal sac.  The cyst was resected completely.  No evidence of injury to the thecal sac or nerve roots.  At this point bilateral discectomy was then performed at L3-4.  Disc base is then prepared for interbody fusion.  With the distractor placed patient's right side.  The space was further cleaned of soft tissue.  An 11 mm Medtronic expandable cage was then impacted into place and expanded.  Distractor removed patient's right side.  Disc base then prepared on the right side.  Morselized autograft packed in the interspace.  Second cage was then packed into place and expanded.  Pedicles of L3 and L4 were dissected free.  Previous pedicle screw track at L4 was utilized.  7.5 mm Everest screws were placed bilaterally at L4.  Pilot holes were then drilled at the L3 level.  This was done utilizing surface landmarks and intraoperative fluoroscopy.  Each pedicle  was then probed using a pedicle awl.  Pedicle tract was then probed and found to be solidly within  the bone.  Each pedicle tract was then tapped with a screw tap.  Screw tap hole was probed and found to be solidly within the bone.  6.5 millimeters screws were placed bilaterally at L3.  Final images revealed good position cages and the hardware at the proper operative level with normal alignment of spine.  Short segment of titanium rod then placed over the screws at L3 and L4.  Locking caps placed over the screws.  Locking caps then engaged with the construct under mild compression.  Each cage was then packed with demineralized bone fibers.  Gelfoam was placed over the laminectomy defect.  Transverse processes of L3 and L4 were decorticated.  Morselized autograft packed posterior laterally.  Vancomycin powder placed in deep wound space.  Wounds and closed in layers with Vicryl sutures.  Steri-Strips and sterile dressing were applied.  No apparent complications.  Patient tolerated the procedure well and she returns to the recovery room postop.

## 2022-12-17 NOTE — Progress Notes (Signed)
Orthopedic Tech Progress Note Patient Details:  Eileen Simpson 1941/02/10 [id number]  PACU RN stated " patient has back brace"   Patient ID: Eileen Simpson, female   DOB: 11-28-1940, 82 y.o.   MRN: [id number]  Eileen Simpson 12/17/2022, 1:29 PM

## 2022-12-17 NOTE — Anesthesia Postprocedure Evaluation (Signed)
Anesthesia Post Note  Patient: Eileen Simpson  Procedure(s) Performed: Posterior Lumbar Interbody Fusion - Lumbar Three-Lumbar Four (Back)     Patient location during evaluation: PACU Anesthesia Type: General Level of consciousness: awake and alert Pain management: pain level controlled Vital Signs Assessment: post-procedure vital signs reviewed and stable Respiratory status: spontaneous breathing, nonlabored ventilation, respiratory function stable and patient connected to nasal cannula oxygen Cardiovascular status: blood pressure returned to baseline and stable Postop Assessment: no apparent nausea or vomiting Anesthetic complications: no   No notable events documented.  Last Vitals:  Vitals:   12/17/22 1345 12/17/22 1410  BP:  (!) 167/72  Pulse: 60 (!) 58  Resp: 16 18  Temp:  36.6 C  SpO2: 95% 100%    Last Pain:  Vitals:   12/17/22 1345  TempSrc:   PainSc: 6                  [name] P Kalli Greenfield

## 2022-12-17 NOTE — Brief Op Note (Signed)
12/17/2022  12:38 PM  PATIENT:  Eileen Simpson  82 y.o. female  PRE-OPERATIVE DIAGNOSIS:  Synovial cyst  POST-OPERATIVE DIAGNOSIS:  Synovial cyst  PROCEDURE:  Procedure(s): Posterior Lumbar Interbody Fusion - Lumbar Three-Lumbar Four (N/A)  SURGEON:  Surgeon(s) and Role:    * Julio Sicks, MD - Primary  PHYSICIAN ASSISTANT:   ASSISTANTSMarland Mcalpine   ANESTHESIA:   general  EBL:  150 mL   BLOOD ADMINISTERED:none  DRAINS: none   LOCAL MEDICATIONS USED:  MARCAINE     SPECIMEN:  No Specimen  DISPOSITION OF SPECIMEN:  N/A  COUNTS:  YES  TOURNIQUET:  * No tourniquets in log *  DICTATION: .Dragon Dictation  PLAN OF CARE: Admit for overnight observation  PATIENT DISPOSITION:  PACU - hemodynamically stable.   Delay start of Pharmacological VTE agent (>24hrs) due to surgical blood loss or risk of bleeding: yes

## 2022-12-17 NOTE — Anesthesia Procedure Notes (Signed)
Procedure Name: Intubation Date/Time: 12/17/2022 9:58 AM  Performed by: Marny Lowenstein, CRNAPre-anesthesia Checklist: Patient identified, Emergency Drugs available, Suction available and Patient being monitored Patient Re-evaluated:Patient Re-evaluated prior to induction Oxygen Delivery Method: Circle system utilized Preoxygenation: Pre-oxygenation with 100% oxygen Induction Type: IV induction Ventilation: Mask ventilation without difficulty Laryngoscope Size: Miller and 2 Grade View: Grade I Tube type: Oral Tube size: 7.0 mm Number of attempts: 1 Airway Equipment and Method: Stylet Placement Confirmation: ETT inserted through vocal cords under direct vision, positive ETCO2 and breath sounds checked- equal and bilateral Secured at: 21 cm Tube secured with: Tape Dental Injury: Teeth and Oropharynx as per pre-operative assessment

## 2022-12-17 NOTE — Transfer of Care (Signed)
Immediate Anesthesia Transfer of Care Note  Patient: Eileen Simpson  Procedure(s) Performed: Posterior Lumbar Interbody Fusion - Lumbar Three-Lumbar Four (Back)  Patient Location: PACU  Anesthesia Type:General  Level of Consciousness: awake, alert , and oriented  Airway & Oxygen Therapy: Patient Spontanous Breathing and Patient connected to face mask oxygen  Post-op Assessment: Report given to RN and Post -op Vital signs reviewed and stable  Post vital signs: Reviewed and stable  Last Vitals:  Vitals Value Taken Time  BP 118/83 12/17/22 1250  Temp    Pulse 62 12/17/22 1254  Resp 15 12/17/22 1254  SpO2 100 % 12/17/22 1254  Vitals shown include unvalidated device data.  Last Pain:  Vitals:   12/17/22 0813  TempSrc:   PainSc: 6       Patients Stated Pain Goal: 1 (12/17/22 0813)  Complications: No notable events documented.

## 2022-12-17 NOTE — H&P (Signed)
Eileen Simpson is an 82 y.o. female.   Chief Complaint: Left lower extremity pain HPI: 82 year old female many years status post L4-5 decompression and fusion surgery with good results presents now with worsening left lower extremity radicular pain failing conservative management.  Workup demonstrates evidence of adjacent level stenosis at L3-4 with a leftward L3-4 synovial cyst causing marked compression of left L4 nerve root.  Patient has failed conservative management and presents now for L3-4 decompression and fusion in hopes improving her symptoms.  Past Medical History:  Diagnosis Date   Complication of anesthesia    " took a longer time to wake up with shoulder surgery"   Gallstone    Glaucoma    Headache    History of hiatal hernia    Pneumonia    Wears glasses    and contact lenses   Wears partial dentures     Past Surgical History:  Procedure Laterality Date   ABDOMINAL HYSTERECTOMY     partial   BACK SURGERY     lumbar fusion   CHOLECYSTECTOMY     COLONOSCOPY     DILATION AND CURETTAGE OF UTERUS     ERCP N/A 12/12/2016   Procedure: ENDOSCOPIC RETROGRADE CHOLANGIOPANCREATOGRAPHY (ERCP);  Surgeon: Vida Rigger, MD;  Location: [facility] END[facility]COPY;  Service: Endoscopy;  Laterality: N/A;   FOOT SURGERY Bilateral    MULTIPLE TOOTH EXTRACTIONS     SHOULDER ARTHROSCOPY     right   TONSILLECTOMY      Family History  Problem Relation Age of Onset   Hypertension Mother    Dementia Mother    Hypertension Father    Bladder Cancer Father    COPD Sister    Breast cancer Maternal Aunt        66s   Emphysema Brother    Social History:  reports that she has quit smoking. Her smoking use included cigarettes. She has never used smokeless tobacco. She reports current alcohol use. She reports that she does not use drugs.  Allergies:  Allergies  Allergen Reactions   Codeine Nausea Only    Medications Prior to Admission  Medication Sig Dispense Refill   aspirin EC 81 MG  tablet Take 81 mg by mouth daily.     diazepam (VALIUM) 5 MG tablet Take 1 tablet (5 mg total) by mouth 2 (two) times daily as needed. 60 tablet 0   gabapentin (NEURONTIN) 300 MG capsule Take 300 mg by mouth at bedtime.     HYDROcodone-acetaminophen (NORCO/VICODIN) 5-325 MG tablet Take 1 tablet by mouth every 6 (six) hours as needed for pain.     Multiple Vitamins-Minerals (MULTIVITAMINS THER. W/MINERALS) TABS Take 1 tablet by mouth daily.       saccharomyces boulardii (FLORASTOR) 250 MG capsule Take 250 mg by mouth daily.     timolol (TIMOPTIC) 0.5 % ophthalmic solution Place 1 drop into the left eye every morning. 15 mL 3   diazepam (VALIUM) 5 MG tablet Take 5 mg by mouth as needed for anxiety. (Patient not taking: Reported on 12/11/2022)     ibuprofen (ADVIL) 200 MG tablet Take 400 mg by mouth every 6 (six) hours as needed for moderate pain.     loratadine (CLARITIN) 10 MG tablet Take 1 tablet (10 mg total) by mouth daily. (Patient not taking: Reported on 12/11/2022) 30 tablet 0    Results for orders placed or performed during the hospital encounter of 12/17/22 (from the past 48 hour(s))  ABO/Rh     Status: None  Collection Time: 12/17/22  8:08 AM  Result Value Ref Range   ABO/RH(D)      A POS Performed at [facility], [address] [place], [place] [address]    No results found.  Pertinent items noted in HPI and remainder of comprehensive ROS otherwise negative.  Blood pressure (!) 151/79, pulse 68, temperature 98.5 F (36.9 C), temperature source Oral, resp. rate 17, height 5\' 6"  (1.676 m), weight 71.7 kg, SpO2 97 %.  She is awake and alert.  She is oriented and appropriate.  Speech is fluent.  Judgment insight are intact.  Cranial nerve function normal bilateral.  Motor examination with some mild weakness in her left quadriceps muscle group otherwise motor strength intact.  Sensory examination with decrease sensation.  Light touch in her left L4 dermatome.  Deep tendon  reflexes are normal active except her Achilles reflexes are absent bilaterally.  Gait is antalgic.  Posture is recently normal.  Examination head ears eyes nose throat is unremarked.  Chest and abdomen are benign.  Extremities are free from injury or deformity. Assessment/Plan Left L3-4 adjacent level stenosis with left L3-4 synovial cyst and ongoing radiculopathy.  Plan bilateral L3-4 decompressive laminotomies and resection of synovial cyst with posterior lumbar MRI fusion utilizing interbody cages, local harvested autograft, and augmented with posterior lateral arthrodesis utilizing nonsegmental pedicle screw fixation and local autografting.  Risks and benefits of been explained.  Patient wishes to proceed.  Kathaleen Maser Eileen Simpson 12/17/2022, 9:36 AM

## 2022-12-18 DIAGNOSIS — M5116 Intervertebral disc disorders with radiculopathy, lumbar region: Secondary | ICD-10-CM | POA: Diagnosis not present

## 2022-12-18 MED ORDER — OXYCODONE-ACETAMINOPHEN 5-325 MG PO TABS: 1.0000 | ORAL_TABLET | ORAL | 0 refills | Status: AC | PRN

## 2022-12-18 MED ORDER — OXYCODONE-ACETAMINOPHEN 5-325 MG PO TABS
1.0000 | ORAL_TABLET | ORAL | Status: DC | PRN
  Administered 2022-12-18 (×2): 2 via ORAL
  Filled 2022-12-18 (×2): qty 2

## 2022-12-18 MED ORDER — METHOCARBAMOL 500 MG PO TABS: 500.0000 mg | ORAL_TABLET | Freq: Four times a day (QID) | ORAL | 1 refills | Status: AC | PRN

## 2022-12-18 MED ORDER — METHOCARBAMOL 500 MG PO TABS
500.0000 mg | ORAL_TABLET | Freq: Four times a day (QID) | ORAL | Status: DC | PRN
  Administered 2022-12-18: 500 mg via ORAL
  Filled 2022-12-18: qty 1

## 2022-12-18 NOTE — Evaluation (Signed)
Occupational Therapy Evaluation Patient Details Name: Eileen Simpson MRN: [id number] DOB: [birth year] Today's Date: 12/18/2022   History of Present Illness Pt is an 82 y/o F s/p L3-4, L4-5 decompressive laminectomies and foraminectomies. PMH includes glaucoma and back surgery.   Clinical Impression   Pt reports using independence at baseline with ADLs and functional mobility, lives with spouse who can assist at d/c. Pt currently needing set up - min A for ADLs, min guard-minA for bed mobility, and min guard for transfers with RW. Pt educated on back precautions, brace wear, and compensatory strategies for ADLs/functional mobility. Pt verbalized understanding. Pt fatiguing quickly and reports increased pain at incision site after ambulation to bathroom, RN notified. Pt presenting with impairments listed below, will follow acutely. Recommend HHOT at d/c.       Recommendations for follow up therapy are one component of a multi-disciplinary discharge planning process, led by the attending physician.  Recommendations may be updated based on patient status, additional functional criteria and insurance authorization.   Assistance Recommended at Discharge Intermittent Supervision/Assistance  Patient can return home with the following A little help with walking and/or transfers;A little help with bathing/dressing/bathroom;Assistance with cooking/housework;Assist for transportation;Help with stairs or ramp for entrance    Functional Status Assessment  Patient has had a recent decline in their functional status and demonstrates the ability to make significant improvements in function in a reasonable and predictable amount of time.  Equipment Recommendations  None recommended by OT (pt has all needed DME)    Recommendations for Other Services PT consult     Precautions / Restrictions Precautions Precautions: Back;Fall Precaution Booklet Issued: Yes (comment) Precaution Comments: educated pt  on 3/3 back precautions Required Braces or Orthoses: Spinal Brace Spinal Brace: Lumbar corset Restrictions Weight Bearing Restrictions: No      Mobility Bed Mobility Overal bed mobility: Needs Assistance Bed Mobility: Sidelying to Sit, Rolling, Sit to Sidelying Rolling: Supervision Sidelying to sit: Min guard     Sit to sidelying: Min assist General bed mobility comments: min A to assist feet back into bed due to pain    Transfers Overall transfer level: Needs assistance Equipment used: Rolling walker (2 wheels) Transfers: Sit to/from Stand Sit to Stand: Min guard           General transfer comment: increased time in standing to steady self      Balance Overall balance assessment: Needs assistance Sitting-balance support: Feet supported Sitting balance-Leahy Scale: Good     Standing balance support: Reliant on assistive device for balance, During functional activity Standing balance-Leahy Scale: Poor Standing balance comment: reliant on RW support                           ADL either performed or assessed with clinical judgement   ADL Overall ADL's : Needs assistance/impaired Eating/Feeding: Set up;Sitting   Grooming: Min guard;Standing   Upper Body Bathing: Minimal assistance;Sitting   Lower Body Bathing: Minimal assistance;Sitting/lateral leans   Upper Body Dressing : Minimal assistance;Sitting   Lower Body Dressing: Minimal assistance;Sitting/lateral leans   Toilet Transfer: Min guard;Rolling walker (2 wheels);Comfort height toilet;Ambulation   Toileting- Clothing Manipulation and Hygiene: Min guard       Functional mobility during ADLs: Min guard;Rolling walker (2 wheels)       Vision Baseline Vision/History: 3 Glaucoma;1 Wears glasses Vision Assessment?: No apparent visual deficits     Perception Perception Perception Tested?: No   Praxis Praxis Praxis tested?: Not  tested    Pertinent Vitals/Pain Pain Assessment Pain  Assessment: Faces Pain Score: 4  Faces Pain Scale: Hurts little more Pain Location: back at incision Pain Descriptors / Indicators: Discomfort Pain Intervention(s): Limited activity within patient's tolerance, Monitored during session, Repositioned, Patient requesting pain meds-RN notified     Hand Dominance     Extremity/Trunk Assessment Upper Extremity Assessment Upper Extremity Assessment: Generalized weakness   Lower Extremity Assessment Lower Extremity Assessment: Defer to PT evaluation   Cervical / Trunk Assessment Cervical / Trunk Assessment: Back Surgery   Communication Communication Communication: No difficulties   Cognition Arousal/Alertness: Awake/alert Behavior During Therapy: WFL for tasks assessed/performed Overall Cognitive Status: Within Functional Limits for tasks assessed                                       General Comments  VSS on RA    Exercises     Shoulder Instructions      Home Living Family/patient expects to be discharged to:: Private residence Living Arrangements: Spouse/significant other Available Help at Discharge: Family;Available 24 hours/day Type of Home: House Home Access: Stairs to enter [employer] of Steps: 1   Home Layout: One level     Bathroom Shower/Tub: Tub/shower unit;Walk-in shower   Bathroom Toilet: Standard Bathroom Accessibility: Yes How Accessible: Accessible via walker Home Equipment: Rolling Walker (2 wheels);Shower seat;Crutches;Cane - single point          Prior Functioning/Environment Prior Level of Function : Independent/Modified Independent             Mobility Comments: no AD use, reports her spouse said she "should use a walker" ADLs Comments: ind        OT Problem List: Decreased strength;Decreased range of motion;Decreased activity tolerance;Impaired balance (sitting and/or standing);Decreased knowledge of precautions      OT Treatment/Interventions:  Self-care/ADL training;Therapeutic exercise;DME and/or AE instruction;Energy conservation;Therapeutic activities;Patient/family education;Balance training    OT Goals(Current goals can be found in the care plan section) Acute Rehab OT Goals Patient Stated Goal: none stated OT Goal Formulation: With patient Time For Goal Achievement: 01/01/23 Potential to Achieve Goals: Good ADL Goals Pt Will Perform Upper Body Dressing: sitting;with modified independence Pt Will Perform Lower Body Dressing: sitting/lateral leans;sit to/from stand;with modified independence Pt Will Transfer to Toilet: with modified independence;ambulating;regular height toilet  OT Frequency: Min 1X/week    Co-evaluation              AM-PAC OT "6 Clicks" Daily Activity     Outcome Measure Help from another person eating meals?: None Help from another person taking care of personal grooming?: A Little Help from another person toileting, which includes using toliet, bedpan, or urinal?: A Little Help from another person bathing (including washing, rinsing, drying)?: A Little Help from another person to put on and taking off regular upper body clothing?: A Little Help from another person to put on and taking off regular lower body clothing?: A Little 6 Click Score: 19   End of Session Equipment Utilized During Treatment: Rolling walker (2 wheels);Back brace Nurse Communication: Mobility status;Patient requests pain meds  Activity Tolerance: Patient tolerated treatment well Patient left: in bed;with call bell/phone within reach  OT Visit Diagnosis: Unsteadiness on feet (R26.81);Other abnormalities of gait and mobility (R26.89);Muscle weakness (generalized) (M62.81)                Time: 1610-9604 OT Time Calculation (min): 40  min Charges:  OT General Charges $OT Visit: 1 Visit OT Evaluation $OT Eval Low Complexity: 1 Low OT Treatments $Self Care/Home Management : 23-37 mins  Carver Fila, OTD, OTR/L SecureChat  Preferred Acute Rehab [phone]   Carver Fila Koonce 12/18/2022, 9:27 AM

## 2022-12-18 NOTE — Discharge Instructions (Addendum)
Wound Care Keep incision covered and dry for three days.   Do not put any creams, lotions, or ointments on incision. Leave steri-strips on back.  They will fall off by themselves. Activity Walk each and every day, increasing distance each day. No lifting greater than 8 lbs.  Avoid excessive neck motion. No driving for 2 weeks; may ride as a passenger locally. If provided with back brace, wear when out of bed.  It is not necessary to wear brace in bed. Diet Resume your normal diet.  Return to Work Will be discussed at you follow up appointment. Call Your Doctor If Any of These Occur Redness, drainage, or swelling at the wound.  Temperature greater than 101 degrees. Severe pain not relieved by pain medication. Incision starts to come apart. Follow Up Appt Call [phone])  for problems.  If you have any hardware placed in your spine, you will need an x-ray before your appointment.

## 2022-12-18 NOTE — Discharge Summary (Signed)
Physician Discharge Summary  Patient ID: Eileen Simpson MRN: [id number] DOB/AGE: [birth year] 82 y.o.  Admit date: 12/17/2022 Discharge date: 12/18/2022  Admission Diagnoses:  Discharge Diagnoses:  Principal Problem:   Synovial cyst of lumbar facet joint   Discharged Condition: good  Hospital Course: Patient admitted to hospital where she underwent uncomplicated L3-4 decompression and fusion surgery.  Postoperatively doing very well.  Preoperative back and radicular pain much improved.  Standing ambulating and voiding without difficulty.  Ready for discharge home.  Consults:   Significant Diagnostic Studies:   Treatments:   Discharge Exam: Blood pressure (!) 102/54, pulse 64, temperature 97.8 F (36.6 C), temperature source Oral, resp. rate 19, height 5\' 6"  (1.676 m), weight 71.7 kg, SpO2 99 %. Awake and alert.  Oriented and appropriate.  Motor and sensory function intact.  Wound clean and dry.  Chest and abdomen benign.  Disposition: Discharge disposition: 01-Home or Self Care        Allergies as of 12/18/2022       Reactions   Codeine Nausea Only        Medication List     TAKE these medications    aspirin EC 81 MG tablet Take 81 mg by mouth daily.   diazepam 5 MG tablet Commonly known as: VALIUM Take 5 mg by mouth as needed for anxiety.   diazepam 5 MG tablet Commonly known as: VALIUM Take 1 tablet (5 mg total) by mouth 2 (two) times daily as needed.   gabapentin 300 MG capsule Commonly known as: NEURONTIN Take 300 mg by mouth at bedtime.   HYDROcodone-acetaminophen 5-325 MG tablet Commonly known as: NORCO/VICODIN Take 1 tablet by mouth every 6 (six) hours as needed for pain.   ibuprofen 200 MG tablet Commonly known as: ADVIL Take 400 mg by mouth every 6 (six) hours as needed for moderate pain.   loratadine 10 MG tablet Commonly known as: CLARITIN Take 1 tablet (10 mg total) by mouth daily.   methocarbamol 500 MG tablet Commonly  known as: ROBAXIN Take 1 tablet (500 mg total) by mouth every 6 (six) hours as needed for muscle spasms.   multivitamins ther. w/minerals Tabs tablet Take 1 tablet by mouth daily.   oxyCODONE-acetaminophen 5-325 MG tablet Commonly known as: PERCOCET/ROXICET Take 1-2 tablets by mouth every 4 (four) hours as needed for severe pain.   saccharomyces boulardii 250 MG capsule Commonly known as: FLORASTOR Take 250 mg by mouth daily.   timolol 0.5 % ophthalmic solution Commonly known as: TIMOPTIC Place 1 drop into the left eye every morning.               Durable Medical Equipment  (From admission, onward)           Start     Ordered   12/17/22 1416  DME Walker rolling  Once       Question:  Patient needs a walker to treat with the following condition  Answer:  Synovial cyst of lumbar facet joint   12/17/22 1415   12/17/22 1416  DME 3 n 1  Once        12/17/22 1415            Follow-up Information     Julio Sicks, MD. Call.   Specialty: Neurosurgery Why: As needed, If symptoms worsen Contact information: [address] [place] [address] [phone]                 Signed: Kathaleen Maser Kendarious Gudino  12/18/2022, 9:48 AM

## 2022-12-18 NOTE — TOC Transition Note (Signed)
Transition of Care [facility]) - CM/SW Discharge Note   Patient Details  Name: Eileen Simpson MRN: [id number] Date of Birth: Jul 23, 1941  Transition of [facility]) CM/SW Contact:  Kermit Balo, RN Phone Number: 12/18/2022, 11:08 AM   Clinical Narrative:    The patient is discharging home with home health services through [facility]. Information on the AVS.  Any needed DME will be obtained by bedside RN.  Pt has transportation home.   Final next level of care: Home w Home Health Services Barriers to Discharge: No Barriers Identified   Patient Goals and CMS Choice CMS Medicare.gov Compare Post Acute Care list provided to:: Patient Choice offered to / list presented to : Patient  Discharge Placement                         Discharge Plan and Services Additional resources added to the After Visit Summary for                            [facility] Arranged: PT, OT [facility] Agency: [facility] Date [facility] Agency Contacted: 12/18/22   Representative spoke with at [facility] Agency: Ephriam Knuckles  Social Determinants of Health (SDOH) Interventions SDOH Screenings   Tobacco Use: Medium Risk (12/17/2022)     Readmission Risk Interventions     No data to display

## 2022-12-18 NOTE — Progress Notes (Signed)
Patient alert and oriented, voiding adequately, skin clean, dry and intact without evidence of skin break down, or symptoms of complications - no redness or edema noted, only slight tenderness at site.  Patient states pain is manageable at time of discharge instructions.. Patient has an appointment with MD in 2 weeks. Now patient waiting for husband for ride home

## 2022-12-18 NOTE — Evaluation (Signed)
Physical Therapy Evaluation  Patient Details Name: Eileen Simpson MRN: [id number] DOB: [birth year] Today's Date: 12/18/2022  History of Present Illness  Pt is an 82 y/o F s/p L3-4, L4-5 decompressive laminectomies and foraminectomies. PMH includes glaucoma and back surgery.   Clinical Impression  Pt admitted with above diagnosis. At the time of PT eval, pt was able to demonstrate transfers and ambulation with gross min guard assist and RW for support. Pt was educated on precautions, brace application/wearing schedule, appropriate activity progression, and car transfer. Pt currently with functional limitations due to the deficits listed below (see PT Problem List). Pt will benefit from skilled PT to increase their independence and safety with mobility to allow discharge to the venue listed below.         Recommendations for follow up therapy are one component of a multi-disciplinary discharge planning process, led by the attending physician.  Recommendations may be updated based on patient status, additional functional criteria and insurance authorization.  Follow Up Recommendations       Assistance Recommended at Discharge PRN  Patient can return home with the following  A little help with walking and/or transfers;A little help with bathing/dressing/bathroom;Assistance with cooking/housework;Assist for transportation;Help with stairs or ramp for entrance    Equipment Recommendations None recommended by PT  Recommendations for Other Services       Functional Status Assessment Patient has had a recent decline in their functional status and demonstrates the ability to make significant improvements in function in a reasonable and predictable amount of time.     Precautions / Restrictions Precautions Precautions: Back;Fall Precaution Booklet Issued: Yes (comment) Precaution Comments: Reviewed handout and pt was cued for precautions during functional mobility. Required Braces or  Orthoses: Spinal Brace Spinal Brace: Lumbar corset Restrictions Weight Bearing Restrictions: No      Mobility  Bed Mobility Overal bed mobility: Needs Assistance Bed Mobility: Sidelying to Sit, Rolling, Sit to Sidelying Rolling: Min guard Sidelying to sit: Min guard     Sit to sidelying: Min guard General bed mobility comments: VC's throughout for optimal log roll technique. No assist required.    Transfers Overall transfer level: Needs assistance Equipment used: Rolling walker (2 wheels) Transfers: Sit to/from Stand Sit to Stand: Min guard           General transfer comment: VC's for hand placement on seated surface for safety. Increased time to power up to full stand.    Ambulation/Gait Ambulation/Gait assistance: Min guard Gait Distance (Feet): 200 Feet Assistive device: Rolling walker (2 wheels) Gait Pattern/deviations: Step-through pattern, Step-to pattern, Decreased stride length, Drifts right/left, Trunk flexed, Narrow base of support Gait velocity: Decreased Gait velocity interpretation: 1.31 - 2.62 ft/sec, indicative of limited community ambulator   General Gait Details: Slow and guarded with step-to pattern initially. Progressed to step-through pattern with cues.  Stairs Stairs: Yes Stairs assistance: Mod assist Stair Management: One rail Left, Step to pattern, Forwards Number of Stairs: 1 (x3) General stair comments: HHA on the R and initially railing on the L, progressing to touching wall for support on the L to simulate home environment. Heavy assist through R hand for power up to next step, however less assist required with each trial.  Wheelchair Mobility    Modified Rankin (Stroke Patients Only)       Balance Overall balance assessment: Needs assistance Sitting-balance support: Feet supported Sitting balance-Leahy Scale: Fair     Standing balance support: Reliant on assistive device for balance, During functional activity Standing  balance-Leahy Scale: Poor Standing balance comment: reliant on RW support                             Pertinent Vitals/Pain Pain Assessment Pain Assessment: Faces Faces Pain Scale: Hurts even more Pain Location: back at incision Pain Descriptors / Indicators: Discomfort Pain Intervention(s): Limited activity within patient's tolerance, Monitored during session, Repositioned    Home Living Family/patient expects to be discharged to:: Private residence Living Arrangements: Spouse/significant other Available Help at Discharge: Family;Available 24 hours/day Type of Home: House Home Access: Stairs to enter   [employer] of Steps: 1   Home Layout: One level Home Equipment: Agricultural consultant (2 wheels);Shower seat;Crutches;Cane - single point      Prior Function Prior Level of Function : Independent/Modified Independent             Mobility Comments: no AD use, reports her spouse said she "should use a walker" ADLs Comments: ind     Hand Dominance        Extremity/Trunk Assessment   Upper Extremity Assessment Upper Extremity Assessment: Generalized weakness    Lower Extremity Assessment Lower Extremity Assessment: Generalized weakness    Cervical / Trunk Assessment Cervical / Trunk Assessment: Back Surgery  Communication   Communication: No difficulties  Cognition Arousal/Alertness: Awake/alert Behavior During Therapy: WFL for tasks assessed/performed Overall Cognitive Status: Within Functional Limits for tasks assessed                                          General Comments General comments (skin integrity, edema, etc.): VSS on RA    Exercises     Assessment/Plan    PT Assessment Patient needs continued PT services  PT Problem List Decreased strength;Decreased activity tolerance;Decreased balance;Decreased mobility;Decreased knowledge of use of DME;Decreased safety awareness;Decreased knowledge of  precautions;Pain       PT Treatment Interventions DME instruction;Gait training;Stair training;Functional mobility training;Therapeutic activities;Therapeutic exercise;Balance training;Patient/family education    PT Goals (Current goals can be found in the Care Plan section)  Acute Rehab PT Goals Patient Stated Goal: Home today PT Goal Formulation: With patient Time For Goal Achievement: 12/25/22 Potential to Achieve Goals: Good    Frequency Min 5X/week     Co-evaluation               AM-PAC PT "6 Clicks" Mobility  Outcome Measure Help needed turning from your back to your side while in a flat bed without using bedrails?: A Little Help needed moving from lying on your back to sitting on the side of a flat bed without using bedrails?: A Little Help needed moving to and from a bed to a chair (including a wheelchair)?: A Little Help needed standing up from a chair using your arms (e.g., wheelchair or bedside chair)?: A Little Help needed to walk in hospital room?: A Little Help needed climbing 3-5 steps with a railing? : A Little 6 Click Score: 18    End of Session Equipment Utilized During Treatment: Gait belt;Back brace Activity Tolerance: Patient tolerated treatment well Patient left: in bed;with call bell/phone within reach Nurse Communication: Mobility status PT Visit Diagnosis: Unsteadiness on feet (R26.81);Pain Pain - part of body:  (back)    Time: 2956-2130 PT Time Calculation (min) (ACUTE ONLY): 29 min   Charges:   PT Evaluation $PT Eval Low Complexity: 1 Low  PT Treatments $Gait Training: 8-22 mins        Conni Slipper, PT, DPT Acute Rehabilitation Services Secure Chat Preferred Office: [phone]   Marylynn Pearson 12/18/2022, 11:23 AM

## 2022-12-20 DIAGNOSIS — M7138 Other bursal cyst, other site: Secondary | ICD-10-CM | POA: Diagnosis not present

## 2022-12-20 DIAGNOSIS — Z7982 Long term (current) use of aspirin: Secondary | ICD-10-CM | POA: Diagnosis not present

## 2022-12-20 DIAGNOSIS — Z981 Arthrodesis status: Secondary | ICD-10-CM | POA: Diagnosis not present

## 2022-12-20 DIAGNOSIS — Z9181 History of falling: Secondary | ICD-10-CM | POA: Diagnosis not present

## 2022-12-20 DIAGNOSIS — Z4789 Encounter for other orthopedic aftercare: Secondary | ICD-10-CM | POA: Diagnosis not present

## 2022-12-25 DIAGNOSIS — Z9181 History of falling: Secondary | ICD-10-CM | POA: Diagnosis not present

## 2022-12-25 DIAGNOSIS — Z7982 Long term (current) use of aspirin: Secondary | ICD-10-CM | POA: Diagnosis not present

## 2022-12-25 DIAGNOSIS — M7138 Other bursal cyst, other site: Secondary | ICD-10-CM | POA: Diagnosis not present

## 2022-12-25 DIAGNOSIS — Z981 Arthrodesis status: Secondary | ICD-10-CM | POA: Diagnosis not present

## 2022-12-25 DIAGNOSIS — Z4789 Encounter for other orthopedic aftercare: Secondary | ICD-10-CM | POA: Diagnosis not present

## 2022-12-26 MED FILL — Heparin Sodium (Porcine) Inj 1000 Unit/ML: INTRAMUSCULAR | Qty: 30 | Status: AC

## 2023-01-16 DIAGNOSIS — M7138 Other bursal cyst, other site: Secondary | ICD-10-CM | POA: Diagnosis not present

## 2023-01-30 DIAGNOSIS — H02834 Dermatochalasis of left upper eyelid: Secondary | ICD-10-CM | POA: Diagnosis not present

## 2023-01-30 DIAGNOSIS — H401131 Primary open-angle glaucoma, bilateral, mild stage: Secondary | ICD-10-CM | POA: Diagnosis not present

## 2023-01-30 DIAGNOSIS — H02831 Dermatochalasis of right upper eyelid: Secondary | ICD-10-CM | POA: Diagnosis not present

## 2023-01-30 DIAGNOSIS — Z961 Presence of intraocular lens: Secondary | ICD-10-CM | POA: Diagnosis not present

## 2023-02-05 ENCOUNTER — Other Ambulatory Visit (HOSPITAL_COMMUNITY): Payer: Self-pay

## 2023-03-04 DIAGNOSIS — Z6825 Body mass index (BMI) 25.0-25.9, adult: Secondary | ICD-10-CM | POA: Diagnosis not present

## 2023-03-04 DIAGNOSIS — M7138 Other bursal cyst, other site: Secondary | ICD-10-CM | POA: Diagnosis not present

## 2023-03-15 DIAGNOSIS — M5416 Radiculopathy, lumbar region: Secondary | ICD-10-CM | POA: Diagnosis not present

## 2023-03-18 DIAGNOSIS — M5416 Radiculopathy, lumbar region: Secondary | ICD-10-CM | POA: Diagnosis not present

## 2023-03-19 ENCOUNTER — Other Ambulatory Visit (HOSPITAL_COMMUNITY): Payer: Self-pay

## 2023-03-20 ENCOUNTER — Other Ambulatory Visit (HOSPITAL_COMMUNITY): Payer: Self-pay

## 2023-03-20 DIAGNOSIS — M5416 Radiculopathy, lumbar region: Secondary | ICD-10-CM | POA: Diagnosis not present

## 2023-03-25 DIAGNOSIS — M5416 Radiculopathy, lumbar region: Secondary | ICD-10-CM | POA: Diagnosis not present

## 2023-03-27 DIAGNOSIS — M5416 Radiculopathy, lumbar region: Secondary | ICD-10-CM | POA: Diagnosis not present

## 2023-04-01 DIAGNOSIS — M5416 Radiculopathy, lumbar region: Secondary | ICD-10-CM | POA: Diagnosis not present

## 2023-04-03 DIAGNOSIS — M5416 Radiculopathy, lumbar region: Secondary | ICD-10-CM | POA: Diagnosis not present

## 2023-04-09 DIAGNOSIS — M5416 Radiculopathy, lumbar region: Secondary | ICD-10-CM | POA: Diagnosis not present

## 2023-04-11 ENCOUNTER — Other Ambulatory Visit (HOSPITAL_COMMUNITY): Payer: Self-pay

## 2023-04-11 MED ORDER — GABAPENTIN 300 MG PO CAPS
300.0000 mg | ORAL_CAPSULE | Freq: Two times a day (BID) | ORAL | 1 refills | Status: AC | PRN
  Filled 2023-04-11: qty 180, 90d supply, fill #0
  Filled 2023-10-01: qty 180, 90d supply, fill #1

## 2023-04-12 ENCOUNTER — Other Ambulatory Visit: Payer: Self-pay

## 2023-04-15 DIAGNOSIS — M5416 Radiculopathy, lumbar region: Secondary | ICD-10-CM | POA: Diagnosis not present

## 2023-04-17 DIAGNOSIS — M7138 Other bursal cyst, other site: Secondary | ICD-10-CM | POA: Diagnosis not present

## 2023-04-20 DIAGNOSIS — Z23 Encounter for immunization: Secondary | ICD-10-CM | POA: Diagnosis not present

## 2023-04-22 DIAGNOSIS — M5416 Radiculopathy, lumbar region: Secondary | ICD-10-CM | POA: Diagnosis not present

## 2023-04-24 DIAGNOSIS — M5416 Radiculopathy, lumbar region: Secondary | ICD-10-CM | POA: Diagnosis not present

## 2023-04-29 DIAGNOSIS — M5416 Radiculopathy, lumbar region: Secondary | ICD-10-CM | POA: Diagnosis not present

## 2023-05-01 DIAGNOSIS — M5416 Radiculopathy, lumbar region: Secondary | ICD-10-CM | POA: Diagnosis not present

## 2023-05-06 DIAGNOSIS — M5416 Radiculopathy, lumbar region: Secondary | ICD-10-CM | POA: Diagnosis not present

## 2023-05-08 DIAGNOSIS — M5416 Radiculopathy, lumbar region: Secondary | ICD-10-CM | POA: Diagnosis not present

## 2023-05-13 DIAGNOSIS — M5416 Radiculopathy, lumbar region: Secondary | ICD-10-CM | POA: Diagnosis not present

## 2023-05-15 DIAGNOSIS — M5416 Radiculopathy, lumbar region: Secondary | ICD-10-CM | POA: Diagnosis not present

## 2023-05-20 DIAGNOSIS — M5416 Radiculopathy, lumbar region: Secondary | ICD-10-CM | POA: Diagnosis not present

## 2023-05-22 DIAGNOSIS — M5416 Radiculopathy, lumbar region: Secondary | ICD-10-CM | POA: Diagnosis not present

## 2023-05-29 DIAGNOSIS — M5416 Radiculopathy, lumbar region: Secondary | ICD-10-CM | POA: Diagnosis not present

## 2023-06-03 DIAGNOSIS — M5416 Radiculopathy, lumbar region: Secondary | ICD-10-CM | POA: Diagnosis not present

## 2023-06-05 DIAGNOSIS — M5416 Radiculopathy, lumbar region: Secondary | ICD-10-CM | POA: Diagnosis not present

## 2023-06-10 DIAGNOSIS — N390 Urinary tract infection, site not specified: Secondary | ICD-10-CM | POA: Diagnosis not present

## 2023-06-10 DIAGNOSIS — M5416 Radiculopathy, lumbar region: Secondary | ICD-10-CM | POA: Diagnosis not present

## 2023-06-10 DIAGNOSIS — R300 Dysuria: Secondary | ICD-10-CM | POA: Diagnosis not present

## 2023-06-11 ENCOUNTER — Other Ambulatory Visit (HOSPITAL_COMMUNITY): Payer: Self-pay

## 2023-06-11 DIAGNOSIS — Z961 Presence of intraocular lens: Secondary | ICD-10-CM | POA: Diagnosis not present

## 2023-06-11 DIAGNOSIS — H02834 Dermatochalasis of left upper eyelid: Secondary | ICD-10-CM | POA: Diagnosis not present

## 2023-06-11 DIAGNOSIS — H401131 Primary open-angle glaucoma, bilateral, mild stage: Secondary | ICD-10-CM | POA: Diagnosis not present

## 2023-06-11 DIAGNOSIS — H02831 Dermatochalasis of right upper eyelid: Secondary | ICD-10-CM | POA: Diagnosis not present

## 2023-06-11 MED ORDER — TIMOLOL MALEATE 0.5 % OP SOLN
1.0000 [drp] | Freq: Every morning | OPHTHALMIC | 3 refills | Status: DC
  Filled 2023-06-11: qty 15, 150d supply, fill #0
  Filled 2023-06-18: qty 10, 100d supply, fill #0
  Filled 2023-11-13: qty 10, 100d supply, fill #1
  Filled 2024-01-23: qty 10, 100d supply, fill #2
  Filled 2024-03-30 – 2024-04-02 (×2): qty 10, 100d supply, fill #3

## 2023-06-12 DIAGNOSIS — M5416 Radiculopathy, lumbar region: Secondary | ICD-10-CM | POA: Diagnosis not present

## 2023-06-13 ENCOUNTER — Other Ambulatory Visit (HOSPITAL_COMMUNITY): Payer: Self-pay

## 2023-06-17 DIAGNOSIS — M5416 Radiculopathy, lumbar region: Secondary | ICD-10-CM | POA: Diagnosis not present

## 2023-06-18 ENCOUNTER — Other Ambulatory Visit: Payer: Self-pay

## 2023-06-19 DIAGNOSIS — M7138 Other bursal cyst, other site: Secondary | ICD-10-CM | POA: Diagnosis not present

## 2023-06-19 DIAGNOSIS — M5416 Radiculopathy, lumbar region: Secondary | ICD-10-CM | POA: Diagnosis not present

## 2023-06-19 DIAGNOSIS — Z6825 Body mass index (BMI) 25.0-25.9, adult: Secondary | ICD-10-CM | POA: Diagnosis not present

## 2023-06-24 DIAGNOSIS — M5416 Radiculopathy, lumbar region: Secondary | ICD-10-CM | POA: Diagnosis not present

## 2023-06-25 ENCOUNTER — Other Ambulatory Visit (HOSPITAL_COMMUNITY): Payer: Self-pay

## 2023-06-26 DIAGNOSIS — R600 Localized edema: Secondary | ICD-10-CM | POA: Diagnosis not present

## 2023-06-26 DIAGNOSIS — I700 Atherosclerosis of aorta: Secondary | ICD-10-CM | POA: Diagnosis not present

## 2023-06-26 DIAGNOSIS — M9983 Other biomechanical lesions of lumbar region: Secondary | ICD-10-CM | POA: Diagnosis not present

## 2023-06-26 DIAGNOSIS — Z0000 Encounter for general adult medical examination without abnormal findings: Secondary | ICD-10-CM | POA: Diagnosis not present

## 2023-06-26 DIAGNOSIS — F39 Unspecified mood [affective] disorder: Secondary | ICD-10-CM | POA: Diagnosis not present

## 2023-06-26 DIAGNOSIS — N390 Urinary tract infection, site not specified: Secondary | ICD-10-CM | POA: Diagnosis not present

## 2023-06-26 DIAGNOSIS — R61 Generalized hyperhidrosis: Secondary | ICD-10-CM | POA: Diagnosis not present

## 2023-06-26 DIAGNOSIS — H409 Unspecified glaucoma: Secondary | ICD-10-CM | POA: Diagnosis not present

## 2023-06-26 DIAGNOSIS — Z79899 Other long term (current) drug therapy: Secondary | ICD-10-CM | POA: Diagnosis not present

## 2023-06-26 DIAGNOSIS — Z23 Encounter for immunization: Secondary | ICD-10-CM | POA: Diagnosis not present

## 2023-06-26 DIAGNOSIS — G4700 Insomnia, unspecified: Secondary | ICD-10-CM | POA: Diagnosis not present

## 2023-07-01 DIAGNOSIS — M5416 Radiculopathy, lumbar region: Secondary | ICD-10-CM | POA: Diagnosis not present

## 2023-07-08 DIAGNOSIS — M5416 Radiculopathy, lumbar region: Secondary | ICD-10-CM | POA: Diagnosis not present

## 2023-07-15 DIAGNOSIS — M5416 Radiculopathy, lumbar region: Secondary | ICD-10-CM | POA: Diagnosis not present

## 2023-07-17 DIAGNOSIS — M5416 Radiculopathy, lumbar region: Secondary | ICD-10-CM | POA: Diagnosis not present

## 2023-07-22 DIAGNOSIS — L820 Inflamed seborrheic keratosis: Secondary | ICD-10-CM | POA: Diagnosis not present

## 2023-07-22 DIAGNOSIS — L2989 Other pruritus: Secondary | ICD-10-CM | POA: Diagnosis not present

## 2023-07-22 DIAGNOSIS — Z789 Other specified health status: Secondary | ICD-10-CM | POA: Diagnosis not present

## 2023-07-22 DIAGNOSIS — R208 Other disturbances of skin sensation: Secondary | ICD-10-CM | POA: Diagnosis not present

## 2023-07-22 DIAGNOSIS — D225 Melanocytic nevi of trunk: Secondary | ICD-10-CM | POA: Diagnosis not present

## 2023-07-22 DIAGNOSIS — M5416 Radiculopathy, lumbar region: Secondary | ICD-10-CM | POA: Diagnosis not present

## 2023-07-22 DIAGNOSIS — L538 Other specified erythematous conditions: Secondary | ICD-10-CM | POA: Diagnosis not present

## 2023-07-22 DIAGNOSIS — L814 Other melanin hyperpigmentation: Secondary | ICD-10-CM | POA: Diagnosis not present

## 2023-07-22 DIAGNOSIS — L821 Other seborrheic keratosis: Secondary | ICD-10-CM | POA: Diagnosis not present

## 2023-07-24 DIAGNOSIS — M5416 Radiculopathy, lumbar region: Secondary | ICD-10-CM | POA: Diagnosis not present

## 2023-07-29 DIAGNOSIS — M5416 Radiculopathy, lumbar region: Secondary | ICD-10-CM | POA: Diagnosis not present

## 2023-08-02 DIAGNOSIS — R0981 Nasal congestion: Secondary | ICD-10-CM | POA: Diagnosis not present

## 2023-08-02 DIAGNOSIS — R051 Acute cough: Secondary | ICD-10-CM | POA: Diagnosis not present

## 2023-08-02 DIAGNOSIS — J029 Acute pharyngitis, unspecified: Secondary | ICD-10-CM | POA: Diagnosis not present

## 2023-08-02 DIAGNOSIS — R5381 Other malaise: Secondary | ICD-10-CM | POA: Diagnosis not present

## 2023-08-02 DIAGNOSIS — R519 Headache, unspecified: Secondary | ICD-10-CM | POA: Diagnosis not present

## 2023-08-05 ENCOUNTER — Other Ambulatory Visit (HOSPITAL_COMMUNITY): Payer: Self-pay

## 2023-08-27 ENCOUNTER — Other Ambulatory Visit: Payer: Self-pay | Admitting: Family Medicine

## 2023-08-27 DIAGNOSIS — Z1231 Encounter for screening mammogram for malignant neoplasm of breast: Secondary | ICD-10-CM

## 2023-09-25 ENCOUNTER — Ambulatory Visit: Payer: HMO

## 2023-10-01 ENCOUNTER — Other Ambulatory Visit (HOSPITAL_COMMUNITY): Payer: Self-pay

## 2023-10-04 ENCOUNTER — Ambulatory Visit
Admission: RE | Admit: 2023-10-04 | Discharge: 2023-10-04 | Disposition: A | Discharge status: Home / Self Care | Payer: HMO | Source: Ambulatory Visit | Attending: Family Medicine | Admitting: Family Medicine

## 2023-10-04 DIAGNOSIS — Z1231 Encounter for screening mammogram for malignant neoplasm of breast: Secondary | ICD-10-CM

## 2023-10-09 DIAGNOSIS — Z03818 Encounter for observation for suspected exposure to other biological agents ruled out: Secondary | ICD-10-CM | POA: Diagnosis not present

## 2023-10-09 DIAGNOSIS — J988 Other specified respiratory disorders: Secondary | ICD-10-CM | POA: Diagnosis not present

## 2023-10-09 DIAGNOSIS — R053 Chronic cough: Secondary | ICD-10-CM | POA: Diagnosis not present

## 2023-10-09 DIAGNOSIS — J029 Acute pharyngitis, unspecified: Secondary | ICD-10-CM | POA: Diagnosis not present

## 2023-10-29 ENCOUNTER — Other Ambulatory Visit (HOSPITAL_COMMUNITY): Payer: Self-pay

## 2023-11-13 ENCOUNTER — Other Ambulatory Visit (HOSPITAL_COMMUNITY): Payer: Self-pay

## 2023-11-20 DIAGNOSIS — H401131 Primary open-angle glaucoma, bilateral, mild stage: Secondary | ICD-10-CM | POA: Diagnosis not present

## 2023-11-20 DIAGNOSIS — H02831 Dermatochalasis of right upper eyelid: Secondary | ICD-10-CM | POA: Diagnosis not present

## 2023-11-20 DIAGNOSIS — Z961 Presence of intraocular lens: Secondary | ICD-10-CM | POA: Diagnosis not present

## 2023-11-20 DIAGNOSIS — H02834 Dermatochalasis of left upper eyelid: Secondary | ICD-10-CM | POA: Diagnosis not present

## 2023-12-25 DIAGNOSIS — Z23 Encounter for immunization: Secondary | ICD-10-CM | POA: Diagnosis not present

## 2023-12-25 DIAGNOSIS — M6530 Trigger finger, unspecified finger: Secondary | ICD-10-CM | POA: Diagnosis not present

## 2023-12-25 DIAGNOSIS — M48061 Spinal stenosis, lumbar region without neurogenic claudication: Secondary | ICD-10-CM | POA: Diagnosis not present

## 2023-12-25 DIAGNOSIS — G4700 Insomnia, unspecified: Secondary | ICD-10-CM | POA: Diagnosis not present

## 2023-12-25 DIAGNOSIS — F330 Major depressive disorder, recurrent, mild: Secondary | ICD-10-CM | POA: Diagnosis not present

## 2024-01-14 DIAGNOSIS — M65332 Trigger finger, left middle finger: Secondary | ICD-10-CM | POA: Diagnosis not present

## 2024-01-14 DIAGNOSIS — M65331 Trigger finger, right middle finger: Secondary | ICD-10-CM | POA: Diagnosis not present

## 2024-01-23 ENCOUNTER — Other Ambulatory Visit (HOSPITAL_COMMUNITY): Payer: Self-pay

## 2024-01-23 DIAGNOSIS — M65332 Trigger finger, left middle finger: Secondary | ICD-10-CM | POA: Diagnosis not present

## 2024-01-23 DIAGNOSIS — M65331 Trigger finger, right middle finger: Secondary | ICD-10-CM | POA: Diagnosis not present

## 2024-02-17 DIAGNOSIS — M170 Bilateral primary osteoarthritis of knee: Secondary | ICD-10-CM | POA: Diagnosis not present

## 2024-03-03 DIAGNOSIS — M79641 Pain in right hand: Secondary | ICD-10-CM | POA: Diagnosis not present

## 2024-03-03 DIAGNOSIS — M65331 Trigger finger, right middle finger: Secondary | ICD-10-CM | POA: Diagnosis not present

## 2024-03-30 ENCOUNTER — Other Ambulatory Visit (HOSPITAL_COMMUNITY): Payer: Self-pay

## 2024-03-31 ENCOUNTER — Other Ambulatory Visit (HOSPITAL_COMMUNITY): Payer: Self-pay

## 2024-03-31 DIAGNOSIS — H02834 Dermatochalasis of left upper eyelid: Secondary | ICD-10-CM | POA: Diagnosis not present

## 2024-03-31 DIAGNOSIS — H02831 Dermatochalasis of right upper eyelid: Secondary | ICD-10-CM | POA: Diagnosis not present

## 2024-03-31 DIAGNOSIS — Z961 Presence of intraocular lens: Secondary | ICD-10-CM | POA: Diagnosis not present

## 2024-03-31 DIAGNOSIS — H401131 Primary open-angle glaucoma, bilateral, mild stage: Secondary | ICD-10-CM | POA: Diagnosis not present

## 2024-04-02 DIAGNOSIS — M1711 Unilateral primary osteoarthritis, right knee: Secondary | ICD-10-CM | POA: Diagnosis not present

## 2024-04-10 DIAGNOSIS — M1711 Unilateral primary osteoarthritis, right knee: Secondary | ICD-10-CM | POA: Diagnosis not present

## 2024-04-17 DIAGNOSIS — M1711 Unilateral primary osteoarthritis, right knee: Secondary | ICD-10-CM | POA: Diagnosis not present

## 2024-04-18 DIAGNOSIS — Z23 Encounter for immunization: Secondary | ICD-10-CM | POA: Diagnosis not present

## 2024-05-19 DIAGNOSIS — H02831 Dermatochalasis of right upper eyelid: Secondary | ICD-10-CM | POA: Diagnosis not present

## 2024-05-19 DIAGNOSIS — Z9889 Other specified postprocedural states: Secondary | ICD-10-CM | POA: Diagnosis not present

## 2024-05-19 DIAGNOSIS — H57813 Brow ptosis, bilateral: Secondary | ICD-10-CM | POA: Diagnosis not present

## 2024-05-19 DIAGNOSIS — H02834 Dermatochalasis of left upper eyelid: Secondary | ICD-10-CM | POA: Diagnosis not present

## 2024-05-19 DIAGNOSIS — H401131 Primary open-angle glaucoma, bilateral, mild stage: Secondary | ICD-10-CM | POA: Diagnosis not present

## 2024-05-29 DIAGNOSIS — M1711 Unilateral primary osteoarthritis, right knee: Secondary | ICD-10-CM | POA: Diagnosis not present

## 2024-06-16 ENCOUNTER — Other Ambulatory Visit: Payer: Self-pay

## 2024-06-16 ENCOUNTER — Other Ambulatory Visit (HOSPITAL_COMMUNITY): Payer: Self-pay

## 2024-06-16 MED ORDER — TIMOLOL MALEATE 0.5 % OP SOLN
1.0000 [drp] | Freq: Every morning | OPHTHALMIC | 3 refills | Status: AC
  Filled 2024-06-16: qty 15, 100d supply, fill #0

## 2024-06-19 DIAGNOSIS — M7062 Trochanteric bursitis, left hip: Secondary | ICD-10-CM | POA: Diagnosis not present

## 2024-06-19 DIAGNOSIS — Z13220 Encounter for screening for lipoid disorders: Secondary | ICD-10-CM | POA: Diagnosis not present

## 2024-06-19 DIAGNOSIS — R600 Localized edema: Secondary | ICD-10-CM | POA: Diagnosis not present

## 2024-06-19 DIAGNOSIS — N390 Urinary tract infection, site not specified: Secondary | ICD-10-CM | POA: Diagnosis not present

## 2024-06-19 DIAGNOSIS — H409 Unspecified glaucoma: Secondary | ICD-10-CM | POA: Diagnosis not present

## 2024-06-19 DIAGNOSIS — M9983 Other biomechanical lesions of lumbar region: Secondary | ICD-10-CM | POA: Diagnosis not present

## 2024-06-19 DIAGNOSIS — R61 Generalized hyperhidrosis: Secondary | ICD-10-CM | POA: Diagnosis not present

## 2024-06-19 DIAGNOSIS — Z136 Encounter for screening for cardiovascular disorders: Secondary | ICD-10-CM | POA: Diagnosis not present

## 2024-06-19 DIAGNOSIS — Z1331 Encounter for screening for depression: Secondary | ICD-10-CM | POA: Diagnosis not present

## 2024-06-19 DIAGNOSIS — G4700 Insomnia, unspecified: Secondary | ICD-10-CM | POA: Diagnosis not present

## 2024-06-19 DIAGNOSIS — Z79899 Other long term (current) drug therapy: Secondary | ICD-10-CM | POA: Diagnosis not present

## 2024-06-19 DIAGNOSIS — Z0000 Encounter for general adult medical examination without abnormal findings: Secondary | ICD-10-CM | POA: Diagnosis not present

## 2024-06-19 DIAGNOSIS — F330 Major depressive disorder, recurrent, mild: Secondary | ICD-10-CM | POA: Diagnosis not present

## 2024-09-10 ENCOUNTER — Other Ambulatory Visit: Payer: Self-pay | Admitting: Family Medicine

## 2024-09-10 DIAGNOSIS — Z1231 Encounter for screening mammogram for malignant neoplasm of breast: Secondary | ICD-10-CM

## 2024-10-05 ENCOUNTER — Ambulatory Visit
